# Patient Record
Sex: Female | Born: 1935 | Race: White | Hispanic: No | Smoking: Never smoker
Health system: Southern US, Community
[De-identification: ages and names within clinical notes are randomized; demographics above are authoritative.]

## PROBLEM LIST (undated history)

## (undated) DIAGNOSIS — I10 Essential (primary) hypertension: Secondary | ICD-10-CM

## (undated) DIAGNOSIS — M1990 Unspecified osteoarthritis, unspecified site: Secondary | ICD-10-CM

## (undated) DIAGNOSIS — Z0000 Encounter for general adult medical examination without abnormal findings: Secondary | ICD-10-CM

## (undated) DIAGNOSIS — K648 Other hemorrhoids: Secondary | ICD-10-CM

## (undated) DIAGNOSIS — R000 Tachycardia, unspecified: Secondary | ICD-10-CM

## (undated) DIAGNOSIS — C801 Malignant (primary) neoplasm, unspecified: Secondary | ICD-10-CM

## (undated) DIAGNOSIS — E079 Disorder of thyroid, unspecified: Secondary | ICD-10-CM

## (undated) DIAGNOSIS — I471 Supraventricular tachycardia, unspecified: Secondary | ICD-10-CM

## (undated) DIAGNOSIS — M1611 Unilateral primary osteoarthritis, right hip: Secondary | ICD-10-CM

## (undated) DIAGNOSIS — H6120 Impacted cerumen, unspecified ear: Secondary | ICD-10-CM

## (undated) DIAGNOSIS — C4491 Basal cell carcinoma of skin, unspecified: Secondary | ICD-10-CM

## (undated) DIAGNOSIS — R739 Hyperglycemia, unspecified: Secondary | ICD-10-CM

## (undated) DIAGNOSIS — B019 Varicella without complication: Secondary | ICD-10-CM

## (undated) DIAGNOSIS — M2550 Pain in unspecified joint: Secondary | ICD-10-CM

## (undated) DIAGNOSIS — E663 Overweight: Secondary | ICD-10-CM

## (undated) HISTORY — DX: Supraventricular tachycardia: I47.1

## (undated) HISTORY — DX: Unilateral primary osteoarthritis, right hip: M16.11

## (undated) HISTORY — DX: Encounter for general adult medical examination without abnormal findings: Z00.00

## (undated) HISTORY — PX: BREAST SURGERY: SHX581

## (undated) HISTORY — DX: Disorder of thyroid, unspecified: E07.9

## (undated) HISTORY — DX: Basal cell carcinoma of skin, unspecified: C44.91

## (undated) HISTORY — PX: DILATION AND CURETTAGE OF UTERUS: SHX78

## (undated) HISTORY — PX: TONSILLECTOMY: SUR1361

## (undated) HISTORY — DX: Pain in unspecified joint: M25.50

## (undated) HISTORY — DX: Tachycardia, unspecified: R00.0

## (undated) HISTORY — DX: Impacted cerumen, unspecified ear: H61.20

## (undated) HISTORY — DX: Supraventricular tachycardia, unspecified: I47.10

## (undated) HISTORY — DX: Other hemorrhoids: K64.8

## (undated) HISTORY — DX: Overweight: E66.3

## (undated) HISTORY — DX: Varicella without complication: B01.9

## (undated) HISTORY — DX: Hyperglycemia, unspecified: R73.9

## (undated) HISTORY — DX: Essential (primary) hypertension: I10

## (undated) HISTORY — DX: Unspecified osteoarthritis, unspecified site: M19.90

## (undated) HISTORY — PX: CATARACT EXTRACTION, BILATERAL: SHX1313

## (undated) HISTORY — DX: Malignant (primary) neoplasm, unspecified: C80.1

---

## 2000-08-26 LAB — HM COLONOSCOPY

## 2009-08-04 ENCOUNTER — Emergency Department (HOSPITAL_COMMUNITY): Admission: EM | Admit: 2009-08-04 | Discharge: 2009-08-04 | Payer: Self-pay | Admitting: Emergency Medicine

## 2010-11-27 LAB — DIFFERENTIAL
Basophils Relative: 0 % (ref 0–1)
Lymphocytes Relative: 15 % (ref 12–46)
Lymphs Abs: 1.8 10*3/uL (ref 0.7–4.0)
Monocytes Absolute: 0.6 10*3/uL (ref 0.1–1.0)
Monocytes Relative: 5 % (ref 3–12)
Neutro Abs: 9.3 10*3/uL — ABNORMAL HIGH (ref 1.7–7.7)

## 2010-11-27 LAB — POCT CARDIAC MARKERS
CKMB, poc: 1.7 ng/mL (ref 1.0–8.0)
Myoglobin, poc: 74 ng/mL (ref 12–200)
Troponin i, poc: <0.05 ng/mL (ref 0.00–0.09)

## 2010-11-27 LAB — URINALYSIS, ROUTINE W REFLEX MICROSCOPIC
Glucose, UA: NEGATIVE mg/dL
Ketones, ur: NEGATIVE mg/dL
Leukocytes, UA: NEGATIVE
Protein, ur: NEGATIVE mg/dL
pH: 7.5 (ref 5.0–8.0)

## 2010-11-27 LAB — CBC
Hemoglobin: 15.1 g/dL — ABNORMAL HIGH (ref 12.0–15.0)
MCHC: 34 g/dL (ref 30.0–36.0)
RBC: 4.55 MIL/uL (ref 3.87–5.11)

## 2010-11-27 LAB — URINE CULTURE: Colony Count: 30000

## 2010-11-27 LAB — BASIC METABOLIC PANEL
GFR calc non Af Amer: >60 mL/min (ref >60)
Glucose, Bld: 101 mg/dL — ABNORMAL HIGH (ref 70–99)
Potassium: 4 mEq/L (ref 3.5–5.1)
Sodium: 137 mEq/L (ref 135–145)

## 2010-11-27 LAB — URINE MICROSCOPIC-ADD ON

## 2011-03-13 DIAGNOSIS — M79673 Pain in unspecified foot: Secondary | ICD-10-CM | POA: Insufficient documentation

## 2011-12-03 ENCOUNTER — Ambulatory Visit (INDEPENDENT_AMBULATORY_CARE_PROVIDER_SITE_OTHER): Payer: Medicare Other | Admitting: Family Medicine

## 2011-12-03 ENCOUNTER — Encounter: Payer: Self-pay | Admitting: Family Medicine

## 2011-12-03 VITALS — BP 129/79 | HR 71 | Temp 97.3°F | Ht 62.0 in | Wt 161.0 lb

## 2011-12-03 DIAGNOSIS — I471 Supraventricular tachycardia: Secondary | ICD-10-CM | POA: Insufficient documentation

## 2011-12-03 DIAGNOSIS — C801 Malignant (primary) neoplasm, unspecified: Secondary | ICD-10-CM | POA: Insufficient documentation

## 2011-12-03 DIAGNOSIS — I10 Essential (primary) hypertension: Secondary | ICD-10-CM | POA: Insufficient documentation

## 2011-12-03 DIAGNOSIS — R000 Tachycardia, unspecified: Secondary | ICD-10-CM | POA: Insufficient documentation

## 2011-12-03 DIAGNOSIS — I498 Other specified cardiac arrhythmias: Secondary | ICD-10-CM

## 2011-12-03 DIAGNOSIS — H6120 Impacted cerumen, unspecified ear: Secondary | ICD-10-CM

## 2011-12-03 DIAGNOSIS — E663 Overweight: Secondary | ICD-10-CM

## 2011-12-03 DIAGNOSIS — H9190 Unspecified hearing loss, unspecified ear: Secondary | ICD-10-CM

## 2011-12-03 DIAGNOSIS — K649 Unspecified hemorrhoids: Secondary | ICD-10-CM

## 2011-12-03 DIAGNOSIS — Z0000 Encounter for general adult medical examination without abnormal findings: Secondary | ICD-10-CM

## 2011-12-03 HISTORY — DX: Overweight: E66.3

## 2011-12-03 HISTORY — DX: Impacted cerumen, unspecified ear: H61.20

## 2011-12-03 HISTORY — DX: Encounter for general adult medical examination without abnormal findings: Z00.00

## 2011-12-03 MED ORDER — ANTIPYRINE-BENZOCAINE 5.4-1.4 % OT SOLN: 3.0000 [drp] | Freq: Every evening | OTIC | Status: AC | PRN

## 2011-12-03 MED ORDER — LISINOPRIL 10 MG PO TABS: 10.0000 mg | ORAL_TABLET | Freq: Two times a day (BID) | ORAL | Status: DC

## 2011-12-03 MED ORDER — METOPROLOL TARTRATE 25 MG PO TABS: 12.5000 mg | ORAL_TABLET | Freq: Two times a day (BID) | ORAL | Status: DC

## 2011-12-03 NOTE — Assessment & Plan Note (Signed)
She cannot remember which type, will request old records

## 2011-12-03 NOTE — Assessment & Plan Note (Signed)
Well controlled on current meds, no changes 

## 2011-12-03 NOTE — Assessment & Plan Note (Signed)
Patient denies any breast concerns or change in bowel habits will request old records and consider lab work at next visit as indicate

## 2011-12-03 NOTE — Assessment & Plan Note (Signed)
Will check labs and request old records before making recommendations

## 2011-12-03 NOTE — Assessment & Plan Note (Signed)
Well controlled on Metoprolol, no changes

## 2011-12-03 NOTE — Patient Instructions (Signed)
Cerumen Impaction A cerumen impaction is when the wax in your ear forms a plug. This plug usually causes reduced hearing. Sometimes it also causes an earache or dizziness. Removing a cerumen impaction can be difficult and painful. The wax sticks to the ear canal. The canal is sensitive and bleeds easily. If you try to remove a heavy wax buildup with a cotton tipped swab, you may push it in further. Irrigation with water, suction, and small ear curettes may be used to clear out the wax. If the impaction is fixed to the skin in the ear canal, ear drops may be needed for a few days to loosen the wax. People who build up a lot of wax frequently can use ear wax removal products available in your local drugstore. SEEK MEDICAL CARE IF:  You develop an earache, increased hearing loss, or marked dizziness. Document Released: 09/19/2004 Document Revised: 08/01/2011 Document Reviewed: 11/09/2009 [facility] Patient Information 2012 [place], [place].   Place the ear drops in both ears at bedtime for 5 days prior to next visit next week

## 2011-12-03 NOTE — Assessment & Plan Note (Signed)
Right greater than left. Given her relative and drops to apply to ears each bedtime for 5 nights prior to visit next week. We will then flushed the ears as tolerated

## 2011-12-03 NOTE — Assessment & Plan Note (Signed)
Patient notes roughly 20 years ago having an injury on a plane flight that resulted in a lot of ear pain and hearing loss at that time. Since then she's had progressive hearing loss. She's had 2 sets of hearing aids. At first it was helpful for about 10 years. They said she got up to half years ago and then relocated here from [place]. She's noted for about a year now her hearing is worsening. She does not have anybody in the area to help her manage this we will refer her to a cardiology at this time for further evaluation and adjustment of her hearing aids as needed.

## 2011-12-03 NOTE — Assessment & Plan Note (Signed)
Long history no recent trouble, no bleeding, encouraged her to cleanse with [facility] Astringent and to use Preparation H prn.

## 2011-12-03 NOTE — Progress Notes (Signed)
Patient ID: Sophia Hancock, female   DOB: Apr 29, 1928, 78 y.o.   MRN: [id number] DARLY MASSI [id number] February 18, 1928 12/03/2011      Progress Note New Patient  Subjective  Chief Complaint  Chief Complaint  Patient presents with  . Establish Care    new patient    HPI  Patient is a 21  -year-old Caucasian female in today for new patient appointment she is relocated back to [place] from [place] in the last 2 years. She can use her primary care doctor as her previous primary care doctor has left. Overall she is doing well. She is maintained on metoprolol and lisinopril for history of hypertension and SVT but has no difficulties there. Has trouble historically with hemorrhoids but never any bleeding and no irritation at present. No other change in bowels are noted. She does note history of hearing loss dating back 20 years. She was flying an airplane when there was some pressure problem and she's hearing loss is progressive and present. On her second set of hearing aids. Notes her most recent year she to have 3 years ago and for about the last 2 years hearing has not continually worse. She denies tinnitus or other neurologic complaints. Denies headaches. She's not had a recent illness, fevers, chills, congestion, GI or GU complaints noted today appear   Past Medical History  Diagnosis Date  . Measles as a child  . Mumps as a child  . Chicken pox as a child  . Hypertension   . Pulse fast   . Hemorrhoids   . SVT (supraventricular tachycardia)     h/o  . Cancer     skin , place on left arm and nose  . Overweight 12/03/2011  . Hearing loss 12/03/2011  . Cerumen impaction 12/03/2011  . Preventative health care 12/03/2011    Past Surgical History  Procedure Date  . Cataract extraction, bilateral     one eye was done twice  . Tonsillectomy   . Breast surgery     benign X 2, b/l    Family History  Problem Relation Age of Onset  . Hypertension Mother   . Anemia Mother   . Arthritis  Father     severe  . Aneurysm Sister     aortic  . Cancer Sister     breast  . Heart disease Sister     aortic aneurysm  . Kidney disease Sister     dialysis  . COPD Sister   . Aneurysm Brother     adbominal  . Aortic aneurysm Brother   . Stroke Paternal Grandfather   . Cholecystitis Daughter     History   Social History  . Marital Status: Widowed    Spouse Name: N/A    Number of Children: N/A  . Years of Education: N/A   Occupational History  . Not on file.   Social History Main Topics  . Smoking status: Never Smoker   . Smokeless tobacco: Never Used  . Alcohol Use: No  . Drug Use: No  . Sexually Active: No   Other Topics Concern  . Not on file   Social History Narrative  . No narrative on file    Current Outpatient Prescriptions on File Prior to Visit  Medication Sig Dispense Refill  . lisinopril (PRINIVIL,ZESTRIL) 10 MG tablet Take 1 tablet (10 mg total) by mouth 2 (two) times daily.  90 tablet  3  . metoprolol tartrate (LOPRESSOR) 25 MG tablet Take 0.5  tablets (12.5 mg total) by mouth 2 (two) times daily.  180 tablet  3    Allergies  Allergen Reactions  . Tylenol (Acetaminophen)     anxiety    Review of Systems  Review of Systems  Constitutional: Negative for fever, chills and malaise/fatigue.  HENT: Positive for hearing loss. Negative for ear pain, nosebleeds, congestion and tinnitus.   Eyes: Negative for discharge.  Respiratory: Negative for cough, sputum production, shortness of breath and wheezing.   Cardiovascular: Negative for chest pain, palpitations and leg swelling.  Gastrointestinal: Negative for heartburn, nausea, vomiting, abdominal pain, diarrhea, constipation, blood in stool and melena.  Genitourinary: Negative for dysuria, urgency, frequency and hematuria.  Musculoskeletal: Negative for myalgias, back pain and falls.  Skin: Negative for rash.  Neurological: Negative for dizziness, tremors, sensory change, focal weakness, loss of  consciousness, weakness and headaches.  Endo/Heme/Allergies: Negative for polydipsia. Does not bruise/bleed easily.  Psychiatric/Behavioral: Negative for depression and suicidal ideas. The patient is not nervous/anxious and does not have insomnia.     Objective  BP 129/79  Pulse 71  Temp(Src) 97.3 F (36.3 C) (Temporal)  Ht 5\' 2"  (1.575 m)  Wt 161 lb (73.029 kg)  BMI 29.45 kg/m2  SpO2 97%  Physical Exam  Physical Exam  Constitutional: She is oriented to person, place, and time and well-developed, well-nourished, and in no distress. No distress.  HENT:  Head: Normocephalic and atraumatic.  Nose: Nose normal.  Mouth/Throat: Oropharynx is clear and moist. No oropharyngeal exudate.       Cerumen impacted on right, partially on left  Eyes: Conjunctivae are normal. Pupils are equal, round, and reactive to light. Right eye exhibits no discharge. Left eye exhibits no discharge. No scleral icterus.  Neck: Normal range of motion. Neck supple. No thyromegaly present.  Cardiovascular: Normal rate, regular rhythm, normal heart sounds and intact distal pulses.   No murmur heard. Pulmonary/Chest: Effort normal and breath sounds normal. No respiratory distress. She has no wheezes. She has no rales.  Abdominal: Soft. Bowel sounds are normal. She exhibits no distension and no mass. There is no tenderness.  Musculoskeletal: Normal range of motion. She exhibits no edema and no tenderness.  Lymphadenopathy:    She has no cervical adenopathy.  Neurological: She is alert and oriented to person, place, and time. She has normal reflexes. No cranial nerve deficit. Coordination normal.  Skin: Skin is warm and dry. No rash noted. She is not diaphoretic.  Psychiatric: Mood, memory and affect normal.       Assessment & Plan  Overweight Will check labs and request old records before making recommendations  Hypertension Well controlled on current meds, no changes  SVT (supraventricular  tachycardia) Well controlled on Metoprolol, no changes  Hemorrhoids Long history no recent trouble, no bleeding, encouraged her to cleanse with [facility] Astringent and to use Preparation H prn.  Cancer She cannot remember which type, will request old records  Cerumen impaction Right greater than left. Given her relative and drops to apply to ears each bedtime for 5 nights prior to visit next week. We will then flushed the ears as tolerated  Hearing loss Patient notes roughly 20 years ago having an injury on a plane flight that resulted in a lot of ear pain and hearing loss at that time. Since then she's had progressive hearing loss. She's had 2 sets of hearing aids. At first it was helpful for about 10 years. They said she got up to half years ago and  then relocated here from [place]. She's noted for about a year now her hearing is worsening. She does not have anybody in the area to help her manage this we will refer her to a cardiology at this time for further evaluation and adjustment of her hearing aids as needed.  Preventative health care Patient denies any breast concerns or change in bowel habits will request old records and consider lab work at next visit as indicate

## 2013-02-04 ENCOUNTER — Encounter: Payer: Self-pay | Admitting: Family Medicine

## 2013-02-04 ENCOUNTER — Ambulatory Visit (INDEPENDENT_AMBULATORY_CARE_PROVIDER_SITE_OTHER): Payer: Medicare Other | Admitting: Family Medicine

## 2013-02-04 VITALS — BP 112/70 | HR 67 | Temp 98.3°F | Ht 62.0 in | Wt 158.0 lb

## 2013-02-04 DIAGNOSIS — R7309 Other abnormal glucose: Secondary | ICD-10-CM

## 2013-02-04 DIAGNOSIS — K649 Unspecified hemorrhoids: Secondary | ICD-10-CM

## 2013-02-04 DIAGNOSIS — E663 Overweight: Secondary | ICD-10-CM

## 2013-02-04 DIAGNOSIS — R739 Hyperglycemia, unspecified: Secondary | ICD-10-CM

## 2013-02-04 DIAGNOSIS — I10 Essential (primary) hypertension: Secondary | ICD-10-CM

## 2013-02-04 MED ORDER — LISINOPRIL 10 MG PO TABS: 5.0000 mg | ORAL_TABLET | Freq: Two times a day (BID) | ORAL | Status: DC

## 2013-02-04 MED ORDER — METOPROLOL SUCCINATE ER 25 MG PO TB24: 25.0000 mg | ORAL_TABLET | Freq: Every day | ORAL | Status: DC

## 2013-02-04 MED ORDER — HYDROCHLOROTHIAZIDE 12.5 MG PO CAPS: 12.5000 mg | ORAL_CAPSULE | ORAL | Status: DC

## 2013-02-04 NOTE — Patient Instructions (Addendum)
Try Salon Pas patches or creams  Want to see bp < 145/92   Back Pain, Adult Low back pain is very common. About 1 in 5 people have back pain.The cause of low back pain is rarely dangerous. The pain often gets better over time.About half of people with a sudden onset of back pain feel better in just 2 weeks. About 8 in 10 people feel better by 6 weeks.  CAUSES Some common causes of back pain include:  Strain of the muscles or ligaments supporting the spine.  Wear and tear (degeneration) of the spinal discs.  Arthritis.  Direct injury to the back. DIAGNOSIS Most of the time, the direct cause of low back pain is not known.However, back pain can be treated effectively even when the exact cause of the pain is unknown.Answering your caregiver's questions about your overall health and symptoms is one of the most accurate ways to make sure the cause of your pain is not dangerous. If your caregiver needs more information, he or she may order lab work or imaging tests (X-rays or MRIs).However, even if imaging tests show changes in your back, this usually does not require surgery. HOME CARE INSTRUCTIONS For many people, back pain returns.Since low back pain is rarely dangerous, it is often a condition that people can learn to [facility] their own.   Remain active. It is stressful on the back to sit or stand in one place. Do not sit, drive, or stand in one place for more than 30 minutes at a time. Take short walks on level surfaces as soon as pain allows.Try to increase the length of time you walk each day.  Do not stay in bed.Resting more than 1 or 2 days can delay your recovery.  Do not avoid exercise or work.Your body is made to move.It is not dangerous to be active, even though your back may hurt.Your back will likely heal faster if you return to being active before your pain is gone.  Pay attention to your body when you bend and lift. Many people have less discomfortwhen lifting if  they bend their knees, keep the load close to their bodies,and avoid twisting. Often, the most comfortable positions are those that put less stress on your recovering back.  Find a comfortable position to sleep. Use a firm mattress and lie on your side with your knees slightly bent. If you lie on your back, put a pillow under your knees.  Only take over-the-counter or prescription medicines as directed by your caregiver. Over-the-counter medicines to reduce pain and inflammation are often the most helpful.Your caregiver may prescribe muscle relaxant drugs.These medicines help dull your pain so you can more quickly return to your normal activities and healthy exercise.  Put ice on the injured area.  Put ice in a plastic bag.  Place a towel between your skin and the bag.  Leave the ice on for 15-20 minutes, 3-4 times a day for the first 2 to 3 days. After that, ice and heat may be alternated to reduce pain and spasms.  Ask your caregiver about trying back exercises and gentle massage. This may be of some benefit.  Avoid feeling anxious or stressed.Stress increases muscle tension and can worsen back pain.It is important to recognize when you are anxious or stressed and learn ways to manage it.Exercise is a great option. SEEK MEDICAL CARE IF:  You have pain that is not relieved with rest or medicine.  You have pain that does not improve in  1 week.  You have new symptoms.  You are generally not feeling well. SEEK IMMEDIATE MEDICAL CARE IF:   You have pain that radiates from your back into your legs.  You develop new bowel or bladder control problems.  You have unusual weakness or numbness in your arms or legs.  You develop nausea or vomiting.  You develop abdominal pain.  You feel faint. Document Released: 08/12/2005 Document Revised: 02/11/2012 Document Reviewed: 12/31/2010 [facility] Patient Information 2014 [place], [place].   Hypertension As your heart beats, it forces  blood through your arteries. This force is your blood pressure. If the pressure is too high, it is called hypertension (HTN) or high blood pressure. HTN is dangerous because you may have it and not know it. High blood pressure may mean that your heart has to work harder to pump blood. Your arteries may be narrow or stiff. The extra work puts you at risk for heart disease, stroke, and other problems.  Blood pressure consists of two numbers, a higher number over a lower, 110/72, for example. It is stated as "110 over 72." The ideal is below 120 for the top number (systolic) and under 80 for the bottom (diastolic). Write down your blood pressure today. You should pay close attention to your blood pressure if you have certain conditions such as:  Heart failure.  Prior heart attack.  Diabetes  Chronic kidney disease.  Prior stroke.  Multiple risk factors for heart disease. To see if you have HTN, your blood pressure should be measured while you are seated with your arm held at the level of the heart. It should be measured at least twice. A one-time elevated blood pressure reading (especially in the Emergency Department) does not mean that you need treatment. There may be conditions in which the blood pressure is different between your right and left arms. It is important to see your caregiver soon for a recheck. Most people have essential hypertension which means that there is not a specific cause. This type of high blood pressure may be lowered by changing lifestyle factors such as:  Stress.  Smoking.  Lack of exercise.  Excessive weight.  Drug/tobacco/alcohol use.  Eating less salt. Most people do not have symptoms from high blood pressure until it has caused damage to the body. Effective treatment can often prevent, delay or reduce that damage. TREATMENT  When a cause has been identified, treatment for high blood pressure is directed at the cause. There are a large number of medications  to treat HTN. These fall into several categories, and your caregiver will help you select the medicines that are best for you. Medications may have side effects. You should review side effects with your caregiver. If your blood pressure stays high after you have made lifestyle changes or started on medicines,   Your medication(s) may need to be changed.  Other problems may need to be addressed.  Be certain you understand your prescriptions, and know how and when to take your medicine.  Be sure to follow up with your caregiver within the time frame advised (usually within two weeks) to have your blood pressure rechecked and to review your medications.  If you are taking more than one medicine to lower your blood pressure, make sure you know how and at what times they should be taken. Taking two medicines at the same time can result in blood pressure that is too low. SEEK IMMEDIATE MEDICAL CARE IF:  You develop a severe headache, blurred or  changing vision, or confusion.  You have unusual weakness or numbness, or a faint feeling.  You have severe chest or abdominal pain, vomiting, or breathing problems. MAKE SURE YOU:   Understand these instructions.  Will watch your condition.  Will get help right away if you are not doing well or get worse. Document Released: 08/12/2005 Document Revised: 11/04/2011 Document Reviewed: 04/01/2008 [facility] Patient Information 2014 [place], [place].   Hemorrhoids Hemorrhoids are swollen veins around the rectum or anus. There are two types of hemorrhoids:   Internal hemorrhoids. These occur in the veins just inside the rectum. They may poke through to the outside and become irritated and painful.  External hemorrhoids. These occur in the veins outside the anus and can be felt as a painful swelling or hard lump near the anus. CAUSES  Pregnancy.   Obesity.   Constipation or diarrhea.   Straining to have a bowel movement.   Sitting for long  periods on the toilet.  Heavy lifting or other activity that caused you to strain.  Anal intercourse. SYMPTOMS   Pain.   Anal itching or irritation.   Rectal bleeding.   Fecal leakage.   Anal swelling.   One or more lumps around the anus.  DIAGNOSIS  Your caregiver may be able to diagnose hemorrhoids by visual examination. Other examinations or tests that may be performed include:   Examination of the rectal area with a gloved hand (digital rectal exam).   Examination of anal canal using a small tube (scope).   A blood test if you have lost a significant amount of blood.  A test to look inside the colon (sigmoidoscopy or colonoscopy). TREATMENT Most hemorrhoids can be treated at home. However, if symptoms do not seem to be getting better or if you have a lot of rectal bleeding, your caregiver may perform a procedure to help make the hemorrhoids get smaller or remove them completely. Possible treatments include:   Placing a rubber band at the base of the hemorrhoid to cut off the circulation (rubber band ligation).   Injecting a chemical to shrink the hemorrhoid (sclerotherapy).   Using a tool to burn the hemorrhoid (infrared light therapy).   Surgically removing the hemorrhoid (hemorrhoidectomy).   Stapling the hemorrhoid to block blood flow to the tissue (hemorrhoid stapling).  HOME CARE INSTRUCTIONS   Eat foods with fiber, such as whole grains, beans, nuts, fruits, and vegetables. Ask your doctor about taking products with added fiber in them (fibersupplements).  Increase fluid intake. Drink enough water and fluids to keep your urine clear or pale yellow.   Exercise regularly.   Go to the bathroom when you have the urge to have a bowel movement. Do not wait.   Avoid straining to have bowel movements.   Keep the anal area dry and clean. Use wet toilet paper or moist towelettes after a bowel movement.   Medicated creams and suppositories may be  used or applied as directed.   Only take over-the-counter or prescription medicines as directed by your caregiver.   Take warm sitz baths for 15 20 minutes, 3 4 times a day to ease pain and discomfort.   Place ice packs on the hemorrhoids if they are tender and swollen. Using ice packs between sitz baths may be helpful.   Put ice in a plastic bag.   Place a towel between your skin and the bag.   Leave the ice on for 15 20 minutes, 3 4 times a day.  Do not use a donut-shaped pillow or sit on the toilet for long periods. This increases blood pooling and pain.  SEEK MEDICAL CARE IF:  You have increasing pain and swelling that is not controlled by treatment or medicine.  You have uncontrolled bleeding.  You have difficulty or you are unable to have a bowel movement.  You have pain or inflammation outside the area of the hemorrhoids. MAKE SURE YOU:  Understand these instructions.  Will watch your condition.  Will get help right away if you are not doing well or get worse. Document Released: 08/09/2000 Document Revised: 07/29/2012 Document Reviewed: 06/16/2012 [facility] Patient Information 2014 [employer], [place].

## 2013-02-05 ENCOUNTER — Encounter: Payer: Self-pay | Admitting: Internal Medicine

## 2013-02-05 LAB — HEPATIC FUNCTION PANEL
Bilirubin, Direct: 0.1 mg/dL (ref 0.0–0.3)
Indirect Bilirubin: 0.4 mg/dL (ref 0.0–0.9)
Total Protein: 6.3 g/dL (ref 6.0–8.3)

## 2013-02-05 LAB — CBC
MCHC: 32.8 g/dL (ref 30.0–36.0)
RDW: 13.4 % (ref 11.5–15.5)

## 2013-02-05 LAB — RENAL FUNCTION PANEL
Albumin: 3.9 g/dL (ref 3.5–5.2)
Calcium: 9.2 mg/dL (ref 8.4–10.5)
Phosphorus: 3.4 mg/dL (ref 2.3–4.6)
Potassium: 3.8 mEq/L (ref 3.5–5.3)
Sodium: 140 mEq/L (ref 135–145)

## 2013-02-05 LAB — LIPID PANEL
Cholesterol: 192 mg/dL (ref 0–200)
Total CHOL/HDL Ratio: 4.6 Ratio
Triglycerides: 108 mg/dL (ref <150)
VLDL: 22 mg/dL (ref 0–40)

## 2013-02-05 LAB — TSH: TSH: 1.907 u[IU]/mL (ref 0.350–4.500)

## 2013-02-06 ENCOUNTER — Encounter: Payer: Self-pay | Admitting: Family Medicine

## 2013-02-06 DIAGNOSIS — R739 Hyperglycemia, unspecified: Secondary | ICD-10-CM

## 2013-02-06 HISTORY — DX: Hyperglycemia, unspecified: R73.9

## 2013-02-06 NOTE — Assessment & Plan Note (Signed)
Well controlled on current meds. No changes. Given refills today.

## 2013-02-06 NOTE — Assessment & Plan Note (Signed)
Encouraged DASH diet, increase activity levels

## 2013-02-06 NOTE — Assessment & Plan Note (Signed)
Encouraged to minimize simple carbohydrates. Increase exercise.

## 2013-02-06 NOTE — Progress Notes (Signed)
Patient ID: Sophia Hancock, female   DOB: 1927-11-15, 79 y.o.   MRN: [id number] Sophia Hancock [id number] 1928/04/18 02/06/2013      Progress Note-Follow Up  Subjective  Chief Complaint  Chief Complaint  Patient presents with  . Follow-up    medication refill    HPI  Patient is an 79 year old Caucasian female in today with her daughter for evaluation. She is complaining of symptomatic hemorrhoids. Has trouble moving her bowels at times. Occasionally sees blood. Denies abdominal pain, nausea, vomiting or anorexia. Denies recent illness. No chest pain, palpitations, shortness of breath, GU complaints  Past Medical History  Diagnosis Date  . Measles as a child  . Mumps as a child  . Chicken pox as a child  . Hypertension   . Pulse fast   . Hemorrhoids   . SVT (supraventricular tachycardia)     h/o  . Cancer     skin , place on left arm and nose  . Overweight(278.02) 12/03/2011  . Hearing loss 12/03/2011  . Cerumen impaction 12/03/2011  . Preventative health care 12/03/2011  . Hyperglycemia 02/06/2013    Past Surgical History  Procedure Laterality Date  . Cataract extraction, bilateral      one eye was done twice  . Tonsillectomy    . Breast surgery      benign X 2, b/l    Family History  Problem Relation Age of Onset  . Hypertension Mother   . Anemia Mother   . Arthritis Father     severe  . Aneurysm Sister     aortic  . Cancer Sister     breast  . Heart disease Sister     aortic aneurysm  . Kidney disease Sister     dialysis  . COPD Sister   . Aneurysm Brother     adbominal  . Aortic aneurysm Brother   . Stroke Paternal Grandfather   . Cholecystitis Daughter     History   Social History  . Marital Status: Widowed    Spouse Name: N/A    Number of Children: N/A  . Years of Education: N/A   Occupational History  . Not on file.   Social History Main Topics  . Smoking status: Never Smoker   . Smokeless tobacco: Never Used  . Alcohol Use: No  . Drug Use:  No  . Sexually Active: No   Other Topics Concern  . Not on file   Social History Narrative  . No narrative on file    Current Outpatient Prescriptions on File Prior to Visit  Medication Sig Dispense Refill  . ibuprofen (ADVIL,MOTRIN) 200 MG tablet Take 200 mg by mouth every 6 (six) hours as needed.       No current facility-administered medications on file prior to visit.    Allergies  Allergen Reactions  . Tylenol (Acetaminophen)     anxiety    Review of Systems  Review of Systems  Constitutional: Negative for fever and malaise/fatigue.  HENT: Negative for congestion.   Eyes: Negative for discharge.  Respiratory: Negative for shortness of breath.   Cardiovascular: Negative for chest pain, palpitations and leg swelling.  Gastrointestinal: Positive for blood in stool. Negative for nausea, abdominal pain, diarrhea and melena.  Genitourinary: Negative for dysuria.  Musculoskeletal: Negative for falls.  Skin: Negative for rash.  Neurological: Negative for loss of consciousness and headaches.  Endo/Heme/Allergies: Negative for polydipsia.  Psychiatric/Behavioral: Negative for depression and suicidal ideas. The patient is not nervous/anxious and  does not have insomnia.     Objective  BP 112/70  Pulse 67  Temp(Src) 98.3 F (36.8 C) (Oral)  Ht 5\' 2"  (1.575 m)  Wt 158 lb (71.668 kg)  BMI 28.89 kg/m2  SpO2 99%  Physical Exam  Physical Exam  Constitutional: She is oriented to person, place, and time and well-developed, well-nourished, and in no distress. No distress.  HENT:  Head: Normocephalic and atraumatic.  Eyes: Conjunctivae are normal.  Neck: Neck supple. No thyromegaly present.  Cardiovascular: Normal rate, regular rhythm and normal heart sounds.   No murmur heard. Pulmonary/Chest: Effort normal and breath sounds normal. She has no wheezes.  Abdominal: She exhibits no distension and no mass.  Musculoskeletal: She exhibits no edema.  Lymphadenopathy:    She  has no cervical adenopathy.  Neurological: She is alert and oriented to person, place, and time.  Skin: Skin is warm and dry. No rash noted. She is not diaphoretic.  Psychiatric: Memory, affect and judgment normal.    Lab Results  Component Value Date   TSH 1.907 02/04/2013   Lab Results  Component Value Date   WBC 10.1 02/04/2013   HGB 13.1 02/04/2013   HCT 39.9 02/04/2013   MCV 94.1 02/04/2013   PLT 242 02/04/2013   Lab Results  Component Value Date   CREATININE 0.95 02/04/2013   BUN 25* 02/04/2013   NA 140 02/04/2013   K 3.8 02/04/2013   CL 101 02/04/2013   CO2 27 02/04/2013   Lab Results  Component Value Date   ALT 11 02/04/2013   AST 20 02/04/2013   ALKPHOS 74 02/04/2013   BILITOT 0.5 02/04/2013   Lab Results  Component Value Date   CHOL 192 02/04/2013   Lab Results  Component Value Date   HDL 42 02/04/2013   Lab Results  Component Value Date   LDLCALC 128* 02/04/2013   Lab Results  Component Value Date   TRIG 108 02/04/2013   Lab Results  Component Value Date   CHOLHDL 4.6 02/04/2013     Assessment & Plan  Hypertension Well controlled on current meds. No changes. Given refills today.   Hyperglycemia Encouraged to minimize simple carbohydrates. Increase exercise.  Overweight(278.02) Encouraged DASH diet, increase activity levels  Hemorrhoids Hemorrhoids are painful and bleeding at times, is referred to gastroenterology for evaluation

## 2013-02-06 NOTE — Assessment & Plan Note (Signed)
Hemorrhoids are painful and bleeding at times, is referred to gastroenterology for evaluation

## 2013-02-08 ENCOUNTER — Encounter: Payer: Self-pay | Admitting: Family Medicine

## 2013-02-08 NOTE — Telephone Encounter (Signed)
Please advise? I haven't called lab results yet?

## 2013-03-05 ENCOUNTER — Ambulatory Visit: Payer: Medicare Other | Admitting: Internal Medicine

## 2013-03-31 ENCOUNTER — Other Ambulatory Visit: Payer: Self-pay

## 2013-04-13 ENCOUNTER — Encounter: Payer: Self-pay | Admitting: Internal Medicine

## 2013-04-13 ENCOUNTER — Ambulatory Visit (INDEPENDENT_AMBULATORY_CARE_PROVIDER_SITE_OTHER): Payer: Medicare Other | Admitting: Internal Medicine

## 2013-04-13 VITALS — BP 110/68 | HR 72 | Ht 61.5 in | Wt 154.0 lb

## 2013-04-13 DIAGNOSIS — K648 Other hemorrhoids: Secondary | ICD-10-CM

## 2013-04-13 HISTORY — DX: Other hemorrhoids: K64.8

## 2013-04-13 NOTE — Assessment & Plan Note (Signed)
She has external tags and internal hemorrhoids. Hx and exam w/ DRE and anoscopy confirm. Given overall hx would not perform endoscopic evaluation at her age - do not think screening her makes sense and she and daughter agree. She was mainly concerned that hemorrhoids could cause cancer and I explained that they do not. We reviewed possible in-office ligation of internal hemorrhoids but have decided to reserve that for more problems than she has now - she believes she is tolerating these ok now. I will see her as needed.

## 2013-04-13 NOTE — Patient Instructions (Addendum)
Today you have been given a handout on hemorrhoid treatment we do here in the office.   I appreciate the opportunity to care for you.

## 2013-04-13 NOTE — Progress Notes (Signed)
  Subjective:    Patient ID: Sophia Hancock, female    DOB: 1928/07/02, 79 y.o.   MRN: [id number]  HPI This is a very nice elderly woman here with daughter to discuss hemorrhoids and she is concerned that there could be a risk of cancer associated with them. She recalls having a colonoscopy in about 2002 - [place], and thinks she may have had one polyp. She has had hemnorrhoids for "50 years or more". There is very rare bleeding but she does have some problems with swelling, discomfort and itching more frequently. However, "not too bad" now. Her main concern today is if they could lead to cancer. She is moving her bowels w/ difficulty or changes and about every day. She uses a salve (Nupercaine) when external tags bother her.  She did report occasional constipation to Dr. Abner Greenspan earlier this year. Is using prunes with benefit.  GI ROS o/w negative.  Medications, allergies, past medical history, past surgical history, family history and social history are reviewed and updated in the EMR.  Review of Systems Hard of hearing, arthritis pain with cane to assist walking, ? Excessive urination All other ROS negative today    Objective:   Physical Exam General:  Well-developed, well-nourished and in no acute distress Eyes:  anicteric. ENT:   Mouth and posterior pharynx free of lesions.  Neck:   supple w/o thyromegaly or mass.  Lungs: Clear to auscultation bilaterally. Heart:  S1S2, no rubs, murmurs, gallops. Abdomen:  soft, non-tender, no hepatosplenomegaly, hernia, or mass and BS+.  Rectal: Female staff present + circumferential anal tags, normal resting tone w/o mass or sig rectocele, some paradoxical descent w/ request to squeeze and no sig descent w/ valsalva  Anoscopy - Grade 1-2 internal hemorrhoids RA>LL>RP Lymph:  no cervical or supraclavicular adenopathy. Extremities:   no edema Skin   no rash. Neuro:  A&O x 3.  Psych:  appropriate mood and  Affect.   Data Reviewed: PCP notes Labs  in EMR Lab Results  Component Value Date   WBC 10.1 02/04/2013   HGB 13.1 02/04/2013   HCT 39.9 02/04/2013   MCV 94.1 02/04/2013   PLT 242 02/04/2013      Assessment & Plan:

## 2013-04-22 ENCOUNTER — Encounter: Payer: Self-pay | Admitting: Family Medicine

## 2013-04-22 ENCOUNTER — Ambulatory Visit (HOSPITAL_BASED_OUTPATIENT_CLINIC_OR_DEPARTMENT_OTHER)
Admission: RE | Admit: 2013-04-22 | Discharge: 2013-04-22 | Disposition: A | Discharge status: Home / Self Care | Payer: Medicare Other | Source: Ambulatory Visit | Attending: Family Medicine | Admitting: Family Medicine

## 2013-04-22 ENCOUNTER — Telehealth: Payer: Self-pay | Admitting: Family Medicine

## 2013-04-22 ENCOUNTER — Ambulatory Visit (INDEPENDENT_AMBULATORY_CARE_PROVIDER_SITE_OTHER): Payer: Medicare Other | Admitting: Family Medicine

## 2013-04-22 VITALS — BP 112/67 | HR 67 | Temp 97.9°F | Ht 62.0 in | Wt 154.0 lb

## 2013-04-22 DIAGNOSIS — M1611 Unilateral primary osteoarthritis, right hip: Secondary | ICD-10-CM

## 2013-04-22 DIAGNOSIS — M25551 Pain in right hip: Secondary | ICD-10-CM

## 2013-04-22 DIAGNOSIS — M169 Osteoarthritis of hip, unspecified: Secondary | ICD-10-CM

## 2013-04-22 DIAGNOSIS — M25559 Pain in unspecified hip: Secondary | ICD-10-CM

## 2013-04-22 DIAGNOSIS — I10 Essential (primary) hypertension: Secondary | ICD-10-CM

## 2013-04-22 DIAGNOSIS — M898X9 Other specified disorders of bone, unspecified site: Secondary | ICD-10-CM | POA: Insufficient documentation

## 2013-04-22 DIAGNOSIS — R739 Hyperglycemia, unspecified: Secondary | ICD-10-CM

## 2013-04-22 DIAGNOSIS — R7309 Other abnormal glucose: Secondary | ICD-10-CM

## 2013-04-22 NOTE — Patient Instructions (Addendum)

## 2013-04-22 NOTE — Telephone Encounter (Signed)
LAB ORDER WEEK OF 08-12-2014 Labs prior to next visit, lipid, renal, cbc, tsh, hepatic, hgba1c

## 2013-04-26 ENCOUNTER — Encounter: Payer: Self-pay | Admitting: Family Medicine

## 2013-04-26 DIAGNOSIS — M1611 Unilateral primary osteoarthritis, right hip: Secondary | ICD-10-CM

## 2013-04-26 HISTORY — DX: Unilateral primary osteoarthritis, right hip: M16.11

## 2013-04-26 NOTE — Assessment & Plan Note (Signed)
Patient describes some recent discomfort and feeling as if her hip could give way on her. Will try Salon Pas patches and if continues to worsen will need referral to ortho

## 2013-04-26 NOTE — Progress Notes (Signed)
Patient ID: Sophia Hancock, female   DOB: 24-Mar-1928, 79 y.o.   MRN: [id number] Sophia Hancock [id number] 1928-04-23 04/26/2013      Progress Note-Follow Up  Subjective  Chief Complaint  Chief Complaint  Patient presents with  . Follow-up    2 month    HPI  Patient is an 79 year old Caucasian female who is in today for followup. She's complaining of some recent right hip dysfunction. She has a sense of it giving out at times. Never has fallen. No numbness tingling or weakness down the arm. No recent trauma. She finds herself just uncomfortable with her hip. No trouble to lift it. Sugars have been relatively well controlled. Recent fingerstick was 140. No polyuria or polydipsia. No chest pain, palpitations, shortness of breath, GI or GU concerns today.  Past Medical History  Diagnosis Date  . Measles as a child  . Mumps as a child  . Chicken pox as a child  . Hypertension   . Pulse fast   . Hemorrhoids   . SVT (supraventricular tachycardia)     h/o  . Cancer     skin , place on left arm and nose  . Overweight(278.02) 12/03/2011  . Hearing loss 12/03/2011  . Cerumen impaction 12/03/2011  . Preventative health care 12/03/2011  . Hyperglycemia 02/06/2013  . Joint pain   . Internal hemorrhoids with bleeding and pain 04/13/2013  . Osteoarthritis of right hip 04/26/2013    Past Surgical History  Procedure Laterality Date  . Cataract extraction, bilateral      one eye was done twice  . Tonsillectomy    . Breast surgery      benign X 2, b/l    Family History  Problem Relation Age of Onset  . Hypertension Mother   . Anemia Mother   . Arthritis Father     severe  . Aneurysm Sister     aortic  . Breast cancer Sister   . Heart disease Sister     aortic aneurysm  . Kidney disease Sister     dialysis  . COPD Sister   . Aneurysm Brother     adbominal  . Aortic aneurysm Brother   . Stroke Paternal Grandfather   . Cholecystitis Daughter     History   Social History  . Marital  Status: Widowed    Spouse Name: N/A    Number of Children: N/A  . Years of Education: N/A   Occupational History  . Not on file.   Social History Main Topics  . Smoking status: Never Smoker   . Smokeless tobacco: Never Used  . Alcohol Use: No  . Drug Use: No  . Sexual Activity: No   Other Topics Concern  . Not on file   Social History Narrative  . No narrative on file    Current Outpatient Prescriptions on File Prior to Visit  Medication Sig Dispense Refill  . hydrochlorothiazide (MICROZIDE) 12.5 MG capsule Take 1 capsule (12.5 mg total) by mouth every morning.  30 capsule  2  . ibuprofen (ADVIL,MOTRIN) 200 MG tablet Take 200 mg by mouth every 6 (six) hours as needed.      Marland Kitchen lisinopril (PRINIVIL,ZESTRIL) 10 MG tablet Take 0.5 tablets (5 mg total) by mouth 2 (two) times daily.  30 tablet  5  . metoprolol succinate (TOPROL-XL) 25 MG 24 hr tablet Take 1 tablet (25 mg total) by mouth daily.  30 tablet  5   No current facility-administered medications on file  prior to visit.    Allergies  Allergen Reactions  . Tylenol [Acetaminophen]     anxiety    Review of Systems  Physical Exam  Constitutional: She is oriented to person, place, and time and well-developed, well-nourished, and in no distress. No distress.  HENT:  Head: Normocephalic and atraumatic.  Eyes: Conjunctivae are normal.  Neck: Neck supple. No thyromegaly present.  Cardiovascular: Normal rate, regular rhythm and normal heart sounds.   No murmur heard. Pulmonary/Chest: Effort normal and breath sounds normal. She has no wheezes.  Abdominal: She exhibits no distension and no mass.  Musculoskeletal: She exhibits no edema.  Lymphadenopathy:    She has no cervical adenopathy.  Neurological: She is alert and oriented to person, place, and time.  Skin: Skin is warm and dry. No rash noted. She is not diaphoretic.  Psychiatric: Memory, affect and judgment normal.    Objective  BP 112/67  Pulse 67  Temp(Src)  97.9 F (36.6 C) (Oral)  Ht 5\' 2"  (1.575 m)  Wt 154 lb 0.6 oz (69.872 kg)  BMI 28.17 kg/m2  SpO2 95%  Physical Exam  Review of Systems  Constitutional: Negative for fever, chills and malaise/fatigue.  HENT: Negative for hearing loss, nosebleeds and congestion.   Eyes: Negative for discharge.  Respiratory: Negative for cough, sputum production, shortness of breath and wheezing.   Cardiovascular: Negative for chest pain, palpitations and leg swelling.  Gastrointestinal: Negative for heartburn, nausea, vomiting, abdominal pain, diarrhea, constipation and blood in stool.  Genitourinary: Negative for dysuria, urgency, frequency and hematuria.  Musculoskeletal: Positive for joint pain. Negative for myalgias, back pain and falls.       Right hip  Skin: Negative for rash.  Neurological: Negative for dizziness, tremors, sensory change, focal weakness, loss of consciousness, weakness and headaches.  Endo/Heme/Allergies: Negative for polydipsia. Does not bruise/bleed easily.  Psychiatric/Behavioral: Negative for depression and suicidal ideas. The patient is not nervous/anxious and does not have insomnia.     Lab Results  Component Value Date   TSH 1.907 02/04/2013   Lab Results  Component Value Date   WBC 10.1 02/04/2013   HGB 13.1 02/04/2013   HCT 39.9 02/04/2013   MCV 94.1 02/04/2013   PLT 242 02/04/2013   Lab Results  Component Value Date   CREATININE 0.95 02/04/2013   BUN 25* 02/04/2013   NA 140 02/04/2013   K 3.8 02/04/2013   CL 101 02/04/2013   CO2 27 02/04/2013   Lab Results  Component Value Date   ALT 11 02/04/2013   AST 20 02/04/2013   ALKPHOS 74 02/04/2013   BILITOT 0.5 02/04/2013   Lab Results  Component Value Date   CHOL 192 02/04/2013   Lab Results  Component Value Date   HDL 42 02/04/2013   Lab Results  Component Value Date   LDLCALC 128* 02/04/2013   Lab Results  Component Value Date   TRIG 108 02/04/2013   Lab Results  Component Value Date   CHOLHDL 4.6  02/04/2013     Assessment & Plan  Osteoarthritis of right hip Patient describes some recent discomfort and feeling as if her hip could give way on her. Will try Salon Pas patches and if continues to worsen will need referral to ortho  Hypertension Well controlled, no changes  Hyperglycemia Blood sugars well controlled, minimize simple carbs

## 2013-04-26 NOTE — Assessment & Plan Note (Signed)
Well controlled, no changes 

## 2013-04-26 NOTE — Assessment & Plan Note (Signed)
Blood sugars well controlled, minimize simple carbs

## 2013-04-27 NOTE — Progress Notes (Signed)
Quick Note:  Patients daughter informed and voiced understanding ______

## 2013-08-21 ENCOUNTER — Other Ambulatory Visit: Payer: Self-pay | Admitting: Family Medicine

## 2013-08-24 ENCOUNTER — Ambulatory Visit: Payer: Medicare Other | Admitting: Family Medicine

## 2013-08-24 DIAGNOSIS — Z0289 Encounter for other administrative examinations: Secondary | ICD-10-CM

## 2013-10-07 ENCOUNTER — Other Ambulatory Visit: Payer: Self-pay | Admitting: Family Medicine

## 2013-10-07 NOTE — Telephone Encounter (Signed)
Rx request Denied, Rx not on active medication list, pt will need appointment/SLS

## 2013-10-08 ENCOUNTER — Ambulatory Visit (INDEPENDENT_AMBULATORY_CARE_PROVIDER_SITE_OTHER): Payer: Medicare Other | Admitting: Family

## 2013-10-08 ENCOUNTER — Encounter: Payer: Self-pay | Admitting: Family

## 2013-10-08 VITALS — BP 120/74 | HR 69 | Temp 97.9°F | Resp 16 | Wt 156.0 lb

## 2013-10-08 DIAGNOSIS — H6692 Otitis media, unspecified, left ear: Secondary | ICD-10-CM

## 2013-10-08 DIAGNOSIS — H6690 Otitis media, unspecified, unspecified ear: Secondary | ICD-10-CM

## 2013-10-08 MED ORDER — AMOXICILLIN 500 MG PO CAPS: 500.0000 mg | ORAL_CAPSULE | Freq: Three times a day (TID) | ORAL | Status: DC

## 2013-10-08 MED ORDER — ANTIPYRINE-BENZOCAINE 5.4-1.4 % OT SOLN: 3.0000 [drp] | Freq: Every evening | OTIC | Status: DC | PRN

## 2013-10-08 NOTE — Patient Instructions (Signed)
Otitis Media, Adult °Otitis media is redness, soreness, and puffiness (swelling) in the space just behind your eardrum (middle ear). It may be caused by allergies or infection. It often happens along with a cold. °HOME CARE °· Take your medicine as told. Finish it even if you start to feel better. °· Only take over-the-counter or prescription medicines for pain, discomfort, or fever as told by your doctor. °· Follow up with your doctor as told. °GET HELP IF: °· You have otitis media only in one ear or bleeding from your nose or both. °· You notice a lump on your neck. °· You are not getting better in 3 5 days. °· You feel worse instead of better. °GET HELP RIGHT AWAY IF:  °· You have pain that is not helped with medicine. °· You have puffiness, redness, or pain around your ear. °· You get a stiff neck. °· You cannot move part of your face (paralysis). °· You notice that the bone behind your ear hurts when you touch it. °MAKE SURE YOU:  °· Understand these instructions. °· Will watch your condition. °· Will get help right away if you are not doing well or get worse. °Document Released: 01/29/2008 Document Revised: 04/14/2013 Document Reviewed: 03/09/2013 °[employer]® Patient Information ©2014 [employer]. ° °

## 2013-10-08 NOTE — Progress Notes (Signed)
Subjective:    Patient ID: Sophia Hancock, female    DOB: 02/15/1928, 80 y.o.   MRN: [id number]  HPI  Ms. Bissonette is an 80 yr old female who presents today with chief complaint of left sided ear pain. She reports intermittent otalgia for years. Most recently, pain started 1 week ago. She reports mild associated nasal drainage.  Reports pain is improved by use of auralgan solution. Denies associated fever. The patient is very hard of hearing and the daughter provides much of the history today.   Review of Systems    see HPI  Past Medical History  Diagnosis Date  . Measles as a child  . Mumps as a child  . Chicken pox as a child  . Hypertension   . Pulse fast   . Hemorrhoids   . SVT (supraventricular tachycardia)     h/o  . Cancer     skin , place on left arm and nose  . Overweight 12/03/2011  . Hearing loss 12/03/2011  . Cerumen impaction 12/03/2011  . Preventative health care 12/03/2011  . Hyperglycemia 02/06/2013  . Joint pain   . Internal hemorrhoids with bleeding and pain 04/13/2013  . Osteoarthritis of right hip 04/26/2013    History   Social History  . Marital Status: Widowed    Spouse Name: N/A    Number of Children: N/A  . Years of Education: N/A   Occupational History  . Not on file.   Social History Main Topics  . Smoking status: Never Smoker   . Smokeless tobacco: Never Used  . Alcohol Use: No  . Drug Use: No  . Sexual Activity: No   Other Topics Concern  . Not on file   Social History Narrative  . No narrative on file    Past Surgical History  Procedure Laterality Date  . Cataract extraction, bilateral      one eye was done twice  . Tonsillectomy    . Breast surgery      benign X 2, b/l    Family History  Problem Relation Age of Onset  . Hypertension Mother   . Anemia Mother   . Arthritis Father     severe  . Aneurysm Sister     aortic  . Breast cancer Sister   . Heart disease Sister     aortic aneurysm  . Kidney disease Sister    dialysis  . COPD Sister   . Aneurysm Brother     adbominal  . Aortic aneurysm Brother   . Stroke Paternal Grandfather   . Cholecystitis Daughter     Allergies  Allergen Reactions  . Tylenol [Acetaminophen]     anxiety    Current Outpatient Prescriptions on File Prior to Visit  Medication Sig Dispense Refill  . hydrochlorothiazide (MICROZIDE) 12.5 MG capsule TAKE ONE CAPSULE BY MOUTH EVERY MORNING  30 capsule  2  . ibuprofen (ADVIL,MOTRIN) 200 MG tablet Take 200 mg by mouth every 6 (six) hours as needed.      Marland Kitchen lisinopril (PRINIVIL,ZESTRIL) 10 MG tablet Take 0.5 tablets (5 mg total) by mouth 2 (two) times daily.  30 tablet  5  . metoprolol succinate (TOPROL-XL) 25 MG 24 hr tablet Take 1 tablet (25 mg total) by mouth daily.  30 tablet  5   No current facility-administered medications on file prior to visit.    BP 120/74  Pulse 69  Temp(Src) 97.9 F (36.6 C) (Oral)  Resp 16  Wt 156 lb  0.6 oz (70.779 kg)  SpO2 99%    Objective:   Physical Exam  Constitutional: She is oriented to person, place, and time. She appears well-developed and well-nourished. No distress.  HENT:  Head: Normocephalic and atraumatic.  L TM is mildly erythematous without bulging. R TM is normal.   Cardiovascular: Normal rate and regular rhythm.   No murmur heard. Pulmonary/Chest: Effort normal and breath sounds normal. No respiratory distress. She has no wheezes. She has no rales. She exhibits no tenderness.  Neurological: She is alert and oriented to person, place, and time.  Psychiatric: She has a normal mood and affect. Her behavior is normal. Judgment and thought content normal.          Assessment & Plan:

## 2013-10-08 NOTE — Assessment & Plan Note (Signed)
80 yr old female presents today with L OM. Rx provided for amoxicillin and refill on auralgan. Pt is instructed to call if symptoms worsen, or if symptoms are not improved in 2-3 days.

## 2013-10-26 ENCOUNTER — Telehealth: Payer: Self-pay

## 2013-10-26 NOTE — Telephone Encounter (Signed)
Patients daughter left a message stating that her mom (pt) received a $50 no show bill in the mail. Pts daughter stated in message that they did not believe they were supposed to have an appt that day and saw the provider in Feb and no one said anything.  I called back and left a message per pts daughters request and stated the appt for 08-24-13 was made after the 04-22-13 visit.   Pt also saw Melissa not Dr Charlett Blake in Feb.

## 2013-11-02 ENCOUNTER — Other Ambulatory Visit: Payer: Self-pay

## 2013-11-02 DIAGNOSIS — I10 Essential (primary) hypertension: Secondary | ICD-10-CM

## 2013-11-02 MED ORDER — METOPROLOL SUCCINATE ER 25 MG PO TB24: 25.0000 mg | ORAL_TABLET | Freq: Every day | ORAL | Status: DC

## 2013-11-02 NOTE — Telephone Encounter (Signed)
Patients daughter called stating that the patient needs her Metoprolol refilled.  I will send in RX but I also informed pts daughter that pt would need a follow up with Dr Charlett Blake within the next couple of months.  pts daughter voiced understanding

## 2013-11-03 ENCOUNTER — Telehealth: Payer: Self-pay | Admitting: Family Medicine

## 2013-11-03 DIAGNOSIS — I10 Essential (primary) hypertension: Secondary | ICD-10-CM

## 2013-11-03 NOTE — Telephone Encounter (Signed)
[facility]  metroprolol patient is out

## 2013-11-05 MED ORDER — METOPROLOL SUCCINATE ER 25 MG PO TB24: 25.0000 mg | ORAL_TABLET | Freq: Every day | ORAL | Status: DC

## 2013-11-05 NOTE — Telephone Encounter (Signed)
Resent rx to [facility].

## 2013-11-29 ENCOUNTER — Other Ambulatory Visit: Payer: Self-pay | Admitting: Family Medicine

## 2013-12-15 DIAGNOSIS — M810 Age-related osteoporosis without current pathological fracture: Secondary | ICD-10-CM | POA: Insufficient documentation

## 2013-12-29 ENCOUNTER — Ambulatory Visit (INDEPENDENT_AMBULATORY_CARE_PROVIDER_SITE_OTHER): Payer: Medicare Other | Admitting: Neurology

## 2013-12-29 ENCOUNTER — Encounter (INDEPENDENT_AMBULATORY_CARE_PROVIDER_SITE_OTHER): Payer: Self-pay

## 2013-12-29 ENCOUNTER — Encounter: Payer: Self-pay | Admitting: Neurology

## 2013-12-29 VITALS — BP 133/76 | HR 70 | Ht 62.5 in | Wt 154.0 lb

## 2013-12-29 DIAGNOSIS — M5416 Radiculopathy, lumbar region: Secondary | ICD-10-CM

## 2013-12-29 DIAGNOSIS — G629 Polyneuropathy, unspecified: Secondary | ICD-10-CM

## 2013-12-29 DIAGNOSIS — Z0289 Encounter for other administrative examinations: Secondary | ICD-10-CM

## 2013-12-29 DIAGNOSIS — G544 Lumbosacral root disorders, not elsewhere classified: Secondary | ICD-10-CM

## 2013-12-29 DIAGNOSIS — G609 Hereditary and idiopathic neuropathy, unspecified: Secondary | ICD-10-CM

## 2013-12-29 DIAGNOSIS — IMO0002 Reserved for concepts with insufficient information to code with codable children: Secondary | ICD-10-CM

## 2013-12-29 NOTE — Patient Instructions (Addendum)
Overall you are doing fairly well but I do want to suggest a few things today:   As far as your medications are concerned, I would like to suggest you initially try just Lyrica 50mg  nightly  As far as diagnostic testing:  1)Please have some blood work checked today 2)Please schedule an EMG/NCS  I would like to see you back once the workup is completed, sooner if we need to. Please call us with any interim questions, concerns, problems, updates or refill requests.   My clinical assistant and will answer any of your questions and relay your messages to me and also relay most of my messages to you.   Our phone number is [phone]. We also have an after hours call service for urgent matters and there is a physician on-call for urgent questions. For any emergencies you know to call 911 or go to the nearest emergency room

## 2013-12-29 NOTE — Procedures (Signed)
     HISTORY:  Sophia Hancock is an 80 year old patient with a history of back pain, right leg discomfort. The patient fell approximately 2 weeks ago, and she has had some back and right leg discomfort since that time. She is being evaluated for this issue.  NERVE CONDUCTION STUDIES:  Nerve conduction studies were performed on both lower extremities. The distal motor latencies for the peroneal nerves were normal bilaterally, but relatively more prolonged on the right than the left. The distal motor latencies and motor amplitudes for the posterior tibial nerves were normal bilaterally. The motor amplitudes for the peroneal nerves were low on the right, and normal on the left. The nerve conduction velocities for the peroneal and posterior tibial nerves were normal bilaterally. The H reflex latency on the right was absent, normal on the left. The peroneal sensory latencies were normal bilaterally.  EMG STUDIES:  EMG study was performed on the right lower extremity:  The tibialis anterior muscle reveals 2 to 5K motor units with slightly decreased recruitment. No fibrillations or positive waves were seen. The peroneus tertius muscle reveals 2 to 4K motor units with full recruitment. No fibrillations or positive waves were seen. The medial gastrocnemius muscle reveals 1 to 3K motor units with full recruitment. No fibrillations or positive waves were seen. The vastus lateralis muscle reveals 2 to 5K motor units with slightly decreased recruitment. No fibrillations or positive waves were seen. The iliopsoas muscle reveals 2 to 4K motor units with full recruitment. No fibrillations or positive waves were seen. The biceps femoris muscle (long head) reveals 2 to 4K motor units with full recruitment. No fibrillations or positive waves were seen. The lumbosacral paraspinal muscles were tested at 3 levels, and revealed no abnormalities of insertional activity at the upper level tested. One to 2+ fibrillations  and positive waves were seen at the middle and lower levels. There was good relaxation.   IMPRESSION:  Nerve conduction studies done on both lower extremities shows some dysfunction involving the peroneal nerves with sensory sparing. This may be related to distal dysfunction of the nerve. EMG evaluation of the right lower extremity was almost normal, but subtle chronic findings of denervation were seen suggesting a very low-grade chronic L4 or L5 radiculopathy. Lumbosacral paraspinal muscles were acutely denervated. It is possible that the patient may have an evolving acute lumbosacral radiculopathy not apparent yet on EMG evaluation. Evolution of denervation on EMG may take 3-4 weeks following an acute injury. Clinical correlation is required.  Jill Alexanders MD 12/29/2013 4:27 PM  [facility] [address] [place], [place] [address]  Phone [phone] Fax [phone]

## 2013-12-29 NOTE — Progress Notes (Signed)
[facility]    Provider:  Dr Janann Colonel Referring Provider: Mosie Lukes, MD Primary Care Physician:  Curly Rim, MD  CC:  Peripheral neuropathy  HPI:  Sophia Hancock is a 80 y.o. female here as a referral from Dr. Neta Mends for evaluation of bilateral LE foot pain and weakness in her right hip.  Has had symptoms for years, it is getting progressively worse. Described as a burning sensation, sharp pain, some swelling. Symptoms are below the ankle. Worse with prolonged standing. Not worse at night when trying to sleep. No weakness in her feet. She does note some weakness in her right hip, was recently diagnosed with osteoperorosis. Reports that her leg (either hip or knee) will give out when she walks, recently started using a cane for walking. Notes some gait instability when walking, has had some severe falls recently. Falls are typically mechanical in nature. Recently prescribed Lyrica 50mg  three times a day, has not started taking it yet.   Had B12 checked in 2010 and it was 353.   Review of Systems: Out of a complete 14 system review, the patient complains of only the following symptoms, and all other reviewed systems are negative. + joint pain, hearing loss  History   Social History  . Marital Status: Widowed    Spouse Name: N/A    Number of Children: N/A  . Years of Education: N/A   Occupational History  . Not on file.   Social History Main Topics  . Smoking status: Never Smoker   . Smokeless tobacco: Never Used  . Alcohol Use: No  . Drug Use: No  . Sexual Activity: No   Other Topics Concern  . Not on file   Social History Narrative   Widowed, 1 child   Right handed   Masters degree   2 cups daily decaff    Family History  Problem Relation Age of Onset  . Hypertension Mother   . Anemia Mother   . Arthritis Father     severe  . Aneurysm Sister     aortic  . Breast cancer Sister   . Heart disease Sister     aortic aneurysm  .  Kidney disease Sister     dialysis  . COPD Sister   . Aneurysm Brother     adbominal  . Aortic aneurysm Brother   . Stroke Paternal Grandfather   . Cholecystitis Daughter     Past Medical History  Diagnosis Date  . Measles as a child  . Mumps as a child  . Chicken pox as a child  . Hypertension   . Pulse fast   . Hemorrhoids   . SVT (supraventricular tachycardia)     h/o  . Cancer     skin , place on left arm and nose  . Overweight 12/03/2011  . Hearing loss 12/03/2011  . Cerumen impaction 12/03/2011  . Preventative health care 12/03/2011  . Hyperglycemia 02/06/2013  . Joint pain   . Internal hemorrhoids with bleeding and pain 04/13/2013  . Osteoarthritis of right hip 04/26/2013    Past Surgical History  Procedure Laterality Date  . Cataract extraction, bilateral      one eye was done twice  . Tonsillectomy    . Breast surgery      benign X 2, b/l    Current Outpatient Prescriptions  Medication Sig Dispense Refill  . amoxicillin (AMOXIL) 500 MG capsule Take 1 capsule (500 mg total) by mouth 3 (three) times daily.  30 capsule  0  . antipyrine-benzocaine (AURALGAN) otic solution Place 3 drops into both ears at bedtime as needed for ear pain.  10 mL  0  . fluticasone (FLONASE) 50 MCG/ACT nasal spray       . hydrochlorothiazide (MICROZIDE) 12.5 MG capsule TAKE ONE CAPSULE BY MOUTH EVERY MORNING  30 capsule  2  . ibuprofen (ADVIL,MOTRIN) 200 MG tablet Take 200 mg by mouth every 6 (six) hours as needed.      Marland Kitchen lisinopril (PRINIVIL,ZESTRIL) 10 MG tablet TAKE 1/2 TABLET BY MOUTH TWICE A DAY  30 tablet  0  . LYRICA 50 MG capsule       . metoprolol succinate (TOPROL-XL) 25 MG 24 hr tablet Take 1 tablet (25 mg total) by mouth daily.  30 tablet  1   No current facility-administered medications for this visit.    Allergies as of 12/29/2013 - Review Complete 12/29/2013  Allergen Reaction Noted  . Tylenol [acetaminophen]  12/03/2011    Vitals: BP 133/76  Pulse 70  Ht 5' 2.5"  (1.588 m)  Wt 154 lb (69.854 kg)  BMI 27.70 kg/m2 Last Weight:  Wt Readings from Last 1 Encounters:  12/29/13 154 lb (69.854 kg)   Last Height:   Ht Readings from Last 1 Encounters:  12/29/13 5' 2.5" (1.588 m)     Physical exam: Exam: Gen: NAD, conversant Eyes: anicteric sclerae, moist conjunctivae HENT: Atraumatic, oropharynx clear Neck: Trachea midline; supple,  Lungs: CTA, no wheezing, rales, rhonic                          CV: RRR, no MRG Abdomen: Soft, non-tender;  Extremities: No peripheral edema  Skin: Normal temperature, no rash,  Psych: Appropriate affect, pleasant  Neuro: MS: AA&Ox3, appropriately interactive, normal affect   Speech: fluent w/o paraphasic error  Memory: good recent and remote recall  CN: PERRL, EOMI no nystagmus, no ptosis, sensation intact to LT V1-V3 bilat, face symmetric, no weakness, hearing grossly intact, palate elevates symmetrically, shoulder shrug 5/5 bilat,  tongue protrudes midline, no fasiculations noted. Patient is hard of hearing R>L  Motor: normal bulk and tone Strength: 5/5  In all extremities with exception of 4+/5 in right hip flexor  Coord: rapid alternating and point-to-point (FNF, HTS) movements intact.  Reflexes: symmetrical, bilat downgoing toes  Sens: decreased LT, PP, temp and vibration bilateral LE R>L  Gait: walks with assistance of a cane, slightly antalgic favoring RLE   Assessment:  After physical and neurologic examination, review of laboratory studies, imaging, neurophysiology testing and pre-existing records, assessment will be reviewed on the problem list.  Plan:  Treatment plan and additional workup will be reviewed under Problem List.  1)peripheral neuropathy 2)weakness  80y/o woman presenting for initial evaluation of bilateral LE painful paresthesias and pain/weakness in RLE. History and symptoms consistent with bilateral LE peripheral neuropathy and question of a RLE lumbar radiculopathy. Will  check blood work, EMG/NCS. Patient was started on Lyrica 50mg  TID by PCP. Will initially start 50mg  qhs and titrate up slowly as tolerated. Follow up once workup completed.   Jim Like, DO  [facility] [address] [place], [place] [address]  Phone [phone] Fax [phone]

## 2013-12-31 LAB — VITAMIN B12: VITAMIN B 12: 334 pg/mL (ref 211–946)

## 2013-12-31 LAB — HGB A1C W/O EAG: HEMOGLOBIN A1C: 6 % — AB (ref 4.8–5.6)

## 2013-12-31 LAB — METHYLMALONIC ACID, SERUM: Methylmalonic Acid: 651 nmol/L — ABNORMAL HIGH (ref 0–378)

## 2013-12-31 LAB — TSH: TSH: 2.33 u[IU]/mL (ref 0.450–4.500)

## 2014-01-03 NOTE — Progress Notes (Signed)
Quick Note:  Attempted to call patient again, with result information, when calling daughter's number, the vm says "this is Darnelle Maffucci", not clear if this is the correct number for Sophia Hancock, patient's home number may be incorrect as well ______

## 2014-01-03 NOTE — Progress Notes (Signed)
Quick Note:  Called patient's daughter left vm to call back with, for lab results. ______

## 2014-01-04 NOTE — Progress Notes (Signed)
Quick Note:  Called patient, no answer left message to return the call. ______

## 2014-01-05 ENCOUNTER — Other Ambulatory Visit: Payer: Self-pay | Admitting: Neurology

## 2014-01-05 DIAGNOSIS — M5416 Radiculopathy, lumbar region: Secondary | ICD-10-CM

## 2014-01-05 NOTE — Progress Notes (Signed)
Quick Note:  Left message on patient's voice mail, that an MRI of the lumbar spine has been ordered, and once insurance has approved, she will receive a call to schedule this appointment. ______

## 2014-01-05 NOTE — Progress Notes (Signed)
Quick Note:  Spoke with patient's daugther Wells Guiles, informed her of that patient will need to start on B-12 supplement 1000 mcg, and elevated blood sugar. Wells Guiles states that it was mentioned that [facility] may have an MRI, or a CT scan, would like to know if she will still needs to have the test. ______

## 2014-01-29 ENCOUNTER — Other Ambulatory Visit: Payer: Self-pay | Admitting: Family Medicine

## 2014-01-31 NOTE — Telephone Encounter (Signed)
Can you please call and check that pt is still seeing Dr Charlett Blake? It shows Kip Corrington for her PCP?

## 2014-01-31 NOTE — Telephone Encounter (Signed)
Spoke to patient daughter Wells Guiles and she states that patient is now under the care of Dr. Neta Mends

## 2014-03-16 ENCOUNTER — Ambulatory Visit: Payer: Medicare Other

## 2014-03-16 ENCOUNTER — Encounter: Payer: Self-pay | Admitting: Podiatry

## 2014-03-16 ENCOUNTER — Ambulatory Visit (INDEPENDENT_AMBULATORY_CARE_PROVIDER_SITE_OTHER): Payer: Medicare Other | Admitting: Podiatry

## 2014-03-16 ENCOUNTER — Ambulatory Visit (INDEPENDENT_AMBULATORY_CARE_PROVIDER_SITE_OTHER): Payer: Medicare Other

## 2014-03-16 VITALS — BP 146/86 | HR 60 | Resp 16 | Wt 165.0 lb

## 2014-03-16 DIAGNOSIS — M7750 Other enthesopathy of unspecified foot: Secondary | ICD-10-CM

## 2014-03-16 DIAGNOSIS — M779 Enthesopathy, unspecified: Secondary | ICD-10-CM

## 2014-03-17 NOTE — Progress Notes (Signed)
Subjective:     Patient ID: Sophia Hancock, female   DOB: [birth year], 80 y.o.   MRN: [id number]  Foot Pain   patient presents with daughter stating that my feet hurt me a lot when her on them and that even at times the sheet hurt at night. States she feels better in cushion shoes but nothing helps her entirely   Review of Systems  All other systems reviewed and are negative.      Objective:   Physical Exam  Nursing note and vitals reviewed. Constitutional: She is oriented to person, place, and time.  Cardiovascular: Intact distal pulses.   Neurological: She is oriented to person, place, and time.  Skin: Skin is dry.   neurovascular status is found to be diminished but intact with diminished range of motion subtalar midtarsal joint and elevation of the lesser digits right over left foot with distal keratotic lesions noted. Pain in the metatarsal heads of both feet of a diffuse pattern with digits found to be well perfused and moderate reduction of arch height upon ambulation     Assessment:     Possible structural issues versus possible low grade idiopathic peripheral neuropathy bilateral    Plan:     H&P and x-rays reviewed and explained condition to family. I scanned for a softer type orthotic to reduce forefoot stresses and reviewed the usage of this and patient will be seen back when those are ready and we will also discussed appropriate shoe gear usage

## 2014-03-21 ENCOUNTER — Other Ambulatory Visit: Payer: Self-pay | Admitting: Family Medicine

## 2014-03-24 ENCOUNTER — Other Ambulatory Visit: Payer: Self-pay | Admitting: Family Medicine

## 2014-04-11 ENCOUNTER — Ambulatory Visit: Payer: Medicare Other

## 2014-04-11 DIAGNOSIS — M779 Enthesopathy, unspecified: Secondary | ICD-10-CM

## 2014-04-11 NOTE — Patient Instructions (Signed)

## 2014-04-11 NOTE — Progress Notes (Signed)
   Subjective:    Patient ID: Sophia Hancock, female    DOB: June 24, 1928, 80 y.o.   MRN: [id number]  HPI  Pt is here to PUO   Review of Systems     Objective:   Physical Exam        Assessment & Plan:

## 2014-04-18 ENCOUNTER — Encounter: Payer: Self-pay | Admitting: Internal Medicine

## 2014-05-05 ENCOUNTER — Other Ambulatory Visit: Payer: Self-pay | Admitting: Family Medicine

## 2014-05-05 NOTE — Telephone Encounter (Signed)
Please advise pt that a month supply was sent to pharmacy but pt needs to be seen for further refills?  Is Dr Charlett Blake still her pcp?

## 2014-05-06 NOTE — Telephone Encounter (Signed)
Left message for patient to return my call.

## 2014-05-06 NOTE — Telephone Encounter (Signed)
Yes still PCP and will call back for appointment

## 2014-06-06 ENCOUNTER — Other Ambulatory Visit: Payer: Self-pay | Admitting: Family Medicine

## 2014-06-06 NOTE — Telephone Encounter (Signed)
Pt is no longer our patient.

## 2014-07-31 ENCOUNTER — Other Ambulatory Visit: Payer: Self-pay | Admitting: Family Medicine

## 2014-09-03 ENCOUNTER — Other Ambulatory Visit: Payer: Self-pay | Admitting: Family Medicine

## 2014-09-05 NOTE — Telephone Encounter (Signed)
Called patient to notify her that she is approaching a year since she has seen Dr. Charlett Blake.  Rx for HCTZ sent for 30 days with no refill.  Pt needs to schedule an apt.  Left message with family member for patient to return call.          EAL

## 2014-10-22 ENCOUNTER — Other Ambulatory Visit: Payer: Self-pay | Admitting: Family Medicine

## 2015-03-29 ENCOUNTER — Other Ambulatory Visit: Payer: Self-pay | Admitting: Otolaryngology

## 2015-03-29 ENCOUNTER — Other Ambulatory Visit (HOSPITAL_COMMUNITY)
Admission: RE | Admit: 2015-03-29 | Discharge: 2015-03-29 | Disposition: A | Discharge status: Home / Self Care | Payer: Medicare Other | Source: Ambulatory Visit | Attending: Otolaryngology | Admitting: Otolaryngology

## 2015-03-29 DIAGNOSIS — E049 Nontoxic goiter, unspecified: Secondary | ICD-10-CM | POA: Insufficient documentation

## 2015-03-31 ENCOUNTER — Other Ambulatory Visit: Payer: Self-pay | Admitting: Otolaryngology

## 2015-03-31 DIAGNOSIS — D440 Neoplasm of uncertain behavior of thyroid gland: Secondary | ICD-10-CM

## 2015-04-04 ENCOUNTER — Ambulatory Visit
Admission: RE | Admit: 2015-04-04 | Discharge: 2015-04-04 | Disposition: A | Discharge status: Home / Self Care | Payer: Medicare Other | Source: Ambulatory Visit | Attending: Otolaryngology | Admitting: Otolaryngology

## 2015-04-04 DIAGNOSIS — D440 Neoplasm of uncertain behavior of thyroid gland: Secondary | ICD-10-CM

## 2015-04-12 ENCOUNTER — Other Ambulatory Visit: Payer: Self-pay | Admitting: Otolaryngology

## 2015-04-12 DIAGNOSIS — E041 Nontoxic single thyroid nodule: Secondary | ICD-10-CM

## 2015-05-10 ENCOUNTER — Other Ambulatory Visit (HOSPITAL_COMMUNITY)
Admission: RE | Admit: 2015-05-10 | Discharge: 2015-05-10 | Disposition: A | Discharge status: Home / Self Care | Payer: Medicare Other | Source: Ambulatory Visit | Attending: Otolaryngology | Admitting: Otolaryngology

## 2015-05-10 ENCOUNTER — Ambulatory Visit
Admission: RE | Admit: 2015-05-10 | Discharge: 2015-05-10 | Disposition: A | Discharge status: Home / Self Care | Payer: Medicare Other | Source: Ambulatory Visit | Attending: Otolaryngology | Admitting: Otolaryngology

## 2015-05-10 DIAGNOSIS — E041 Nontoxic single thyroid nodule: Secondary | ICD-10-CM

## 2015-05-10 DIAGNOSIS — E042 Nontoxic multinodular goiter: Secondary | ICD-10-CM | POA: Diagnosis present

## 2015-08-31 ENCOUNTER — Other Ambulatory Visit: Payer: Self-pay | Admitting: Otolaryngology

## 2015-08-31 DIAGNOSIS — D440 Neoplasm of uncertain behavior of thyroid gland: Secondary | ICD-10-CM

## 2015-09-06 ENCOUNTER — Ambulatory Visit
Admission: RE | Admit: 2015-09-06 | Discharge: 2015-09-06 | Disposition: A | Discharge status: Home / Self Care | Payer: Medicare Other | Source: Ambulatory Visit | Attending: Otolaryngology | Admitting: Otolaryngology

## 2015-09-06 DIAGNOSIS — D440 Neoplasm of uncertain behavior of thyroid gland: Secondary | ICD-10-CM

## 2016-03-20 ENCOUNTER — Encounter: Payer: Self-pay | Admitting: Internal Medicine

## 2016-06-13 ENCOUNTER — Encounter: Payer: Self-pay | Admitting: Podiatry

## 2016-06-13 ENCOUNTER — Ambulatory Visit (INDEPENDENT_AMBULATORY_CARE_PROVIDER_SITE_OTHER): Payer: Medicare Other | Admitting: Podiatry

## 2016-06-13 DIAGNOSIS — L608 Other nail disorders: Secondary | ICD-10-CM

## 2016-06-13 DIAGNOSIS — M79609 Pain in unspecified limb: Principal | ICD-10-CM

## 2016-06-13 DIAGNOSIS — B351 Tinea unguium: Secondary | ICD-10-CM | POA: Diagnosis not present

## 2016-06-13 DIAGNOSIS — M79676 Pain in unspecified toe(s): Secondary | ICD-10-CM | POA: Diagnosis not present

## 2016-06-13 DIAGNOSIS — L603 Nail dystrophy: Secondary | ICD-10-CM

## 2016-06-15 NOTE — Progress Notes (Signed)

## 2016-07-08 IMAGING — US US SOFT TISSUE HEAD/NECK
1 series · 13 of 25 positions shown · non-contrast
Comparison: None.

CLINICAL DATA: 87-year-old female with thyroid neoplasm not
otherwise specified

EXAM:
THYROID ULTRASOUND
TECHNIQUE: Ultrasound examination of the thyroid gland and adjacent soft
tissues was performed.

[Series 1: us soft tissue head/neck · 0.08mm/px · 13 of 44 slices shown]
[im 1/44]
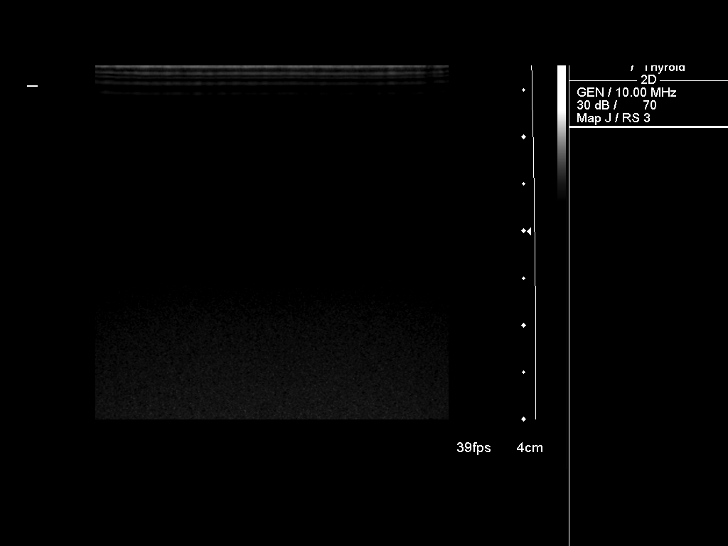
[im 4/44]
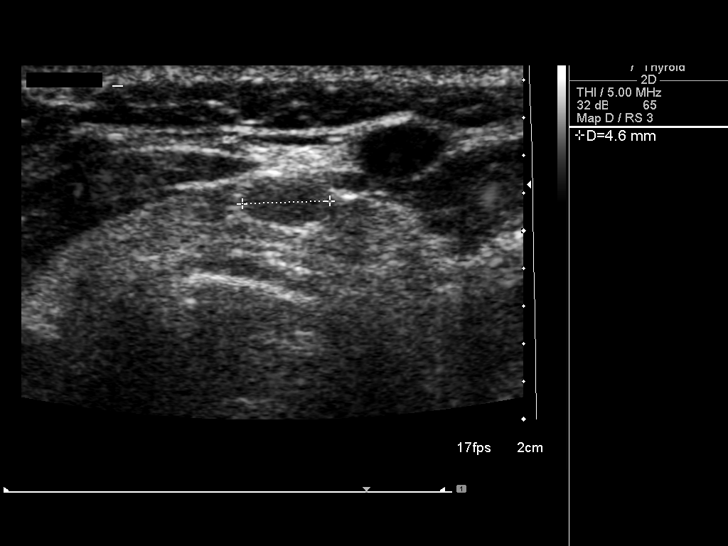
[im 8/44]
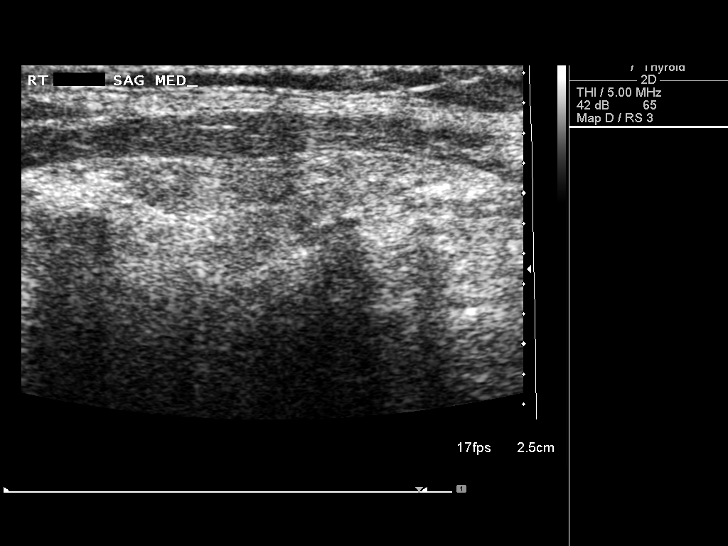
[im 11/44]
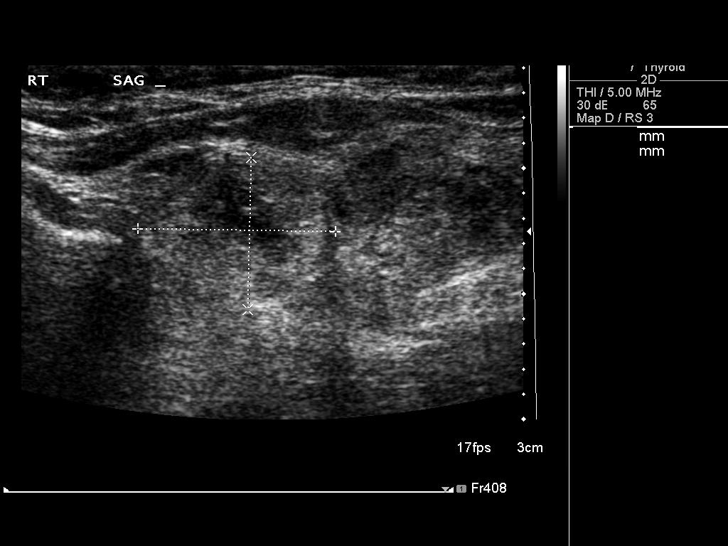
[im 15/44]
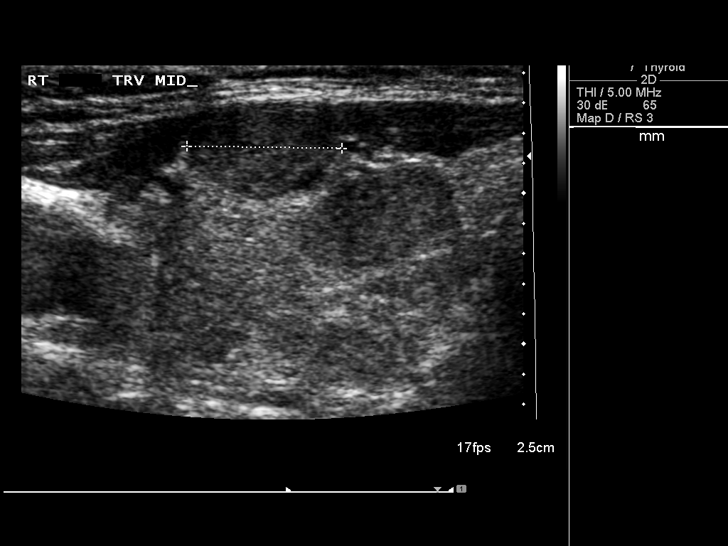
[im 18/44]
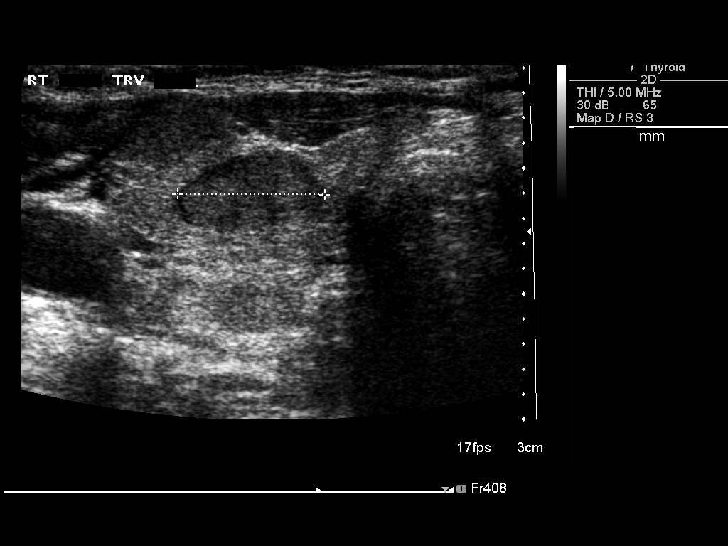
[im 22/44]
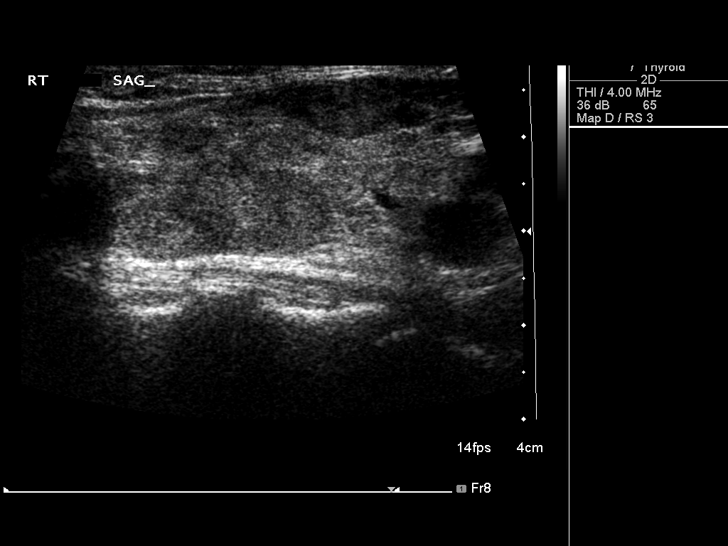
[im 26/44]
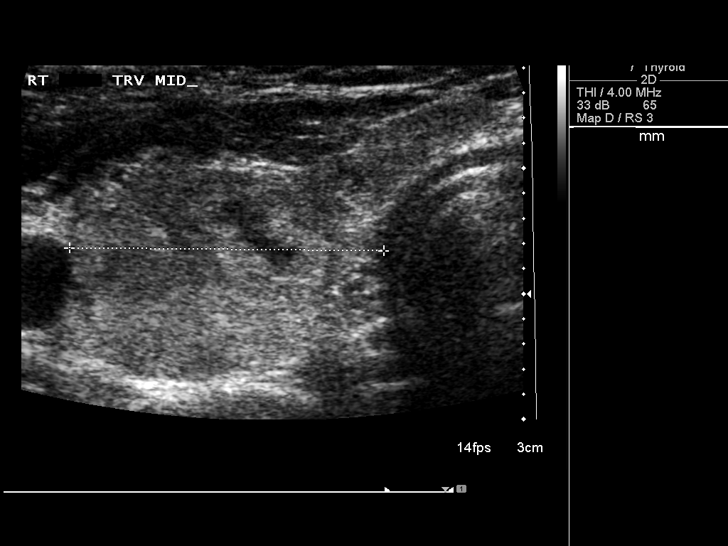
[im 29/44]
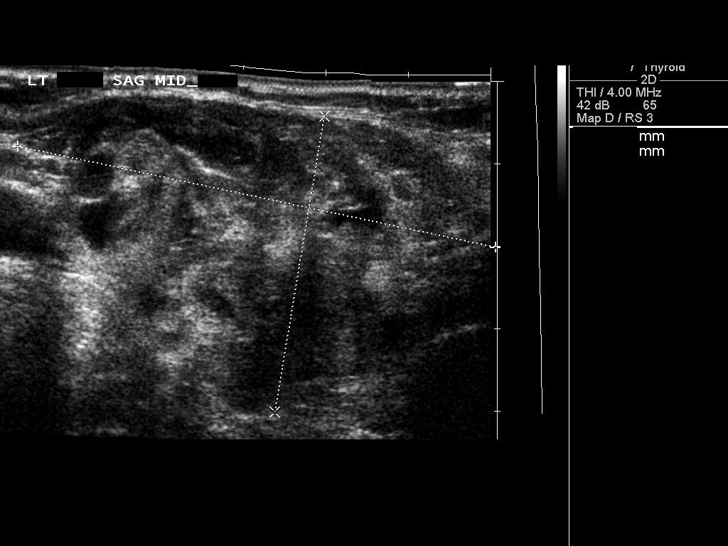
[im 33/44]
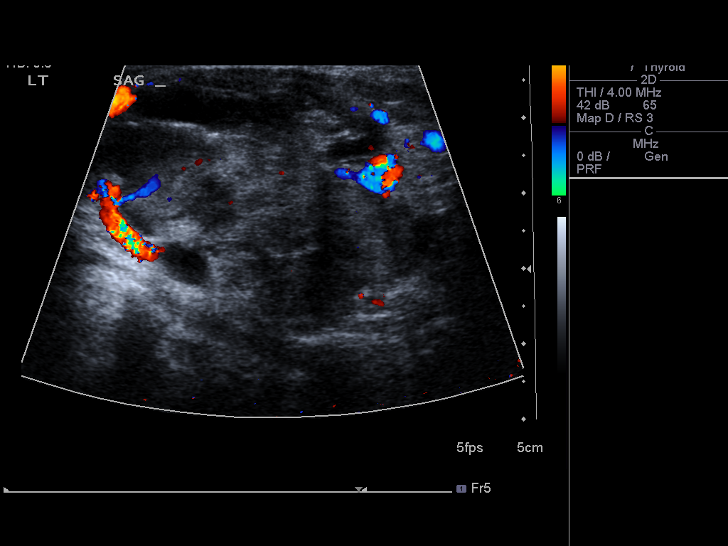
[im 36/44]
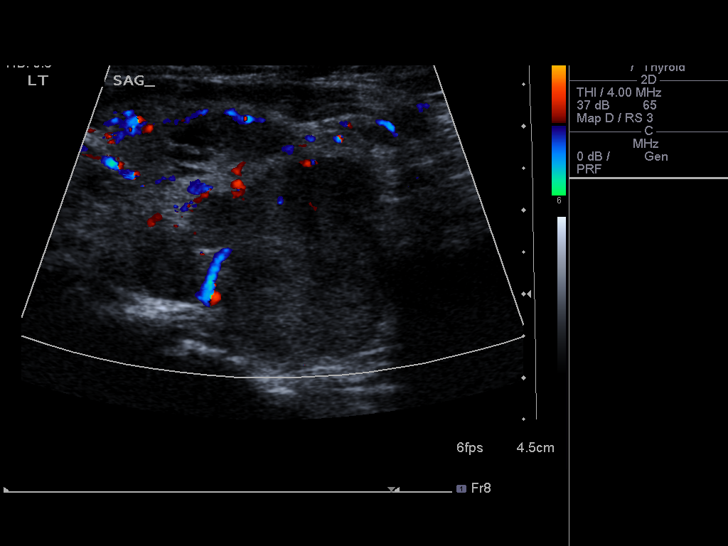
[im 40/44]
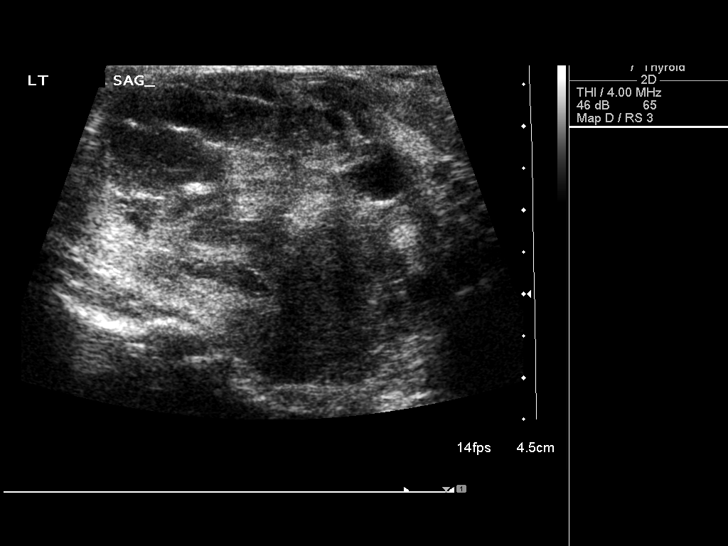
[im 44/44]
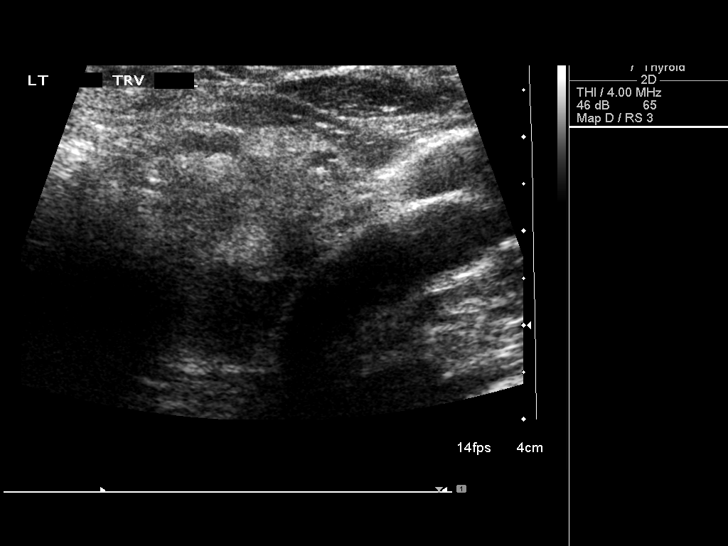

[13 of 25 positions shown; findings below may reference images not displayed]

FINDINGS: Right thyroid lobe

Measurements: 5.4 x 2.3 x 2.5 cm. Diffusely enlarged and
heterogeneous thyroid gland with multiple nodules and a pseudo
nodules.

1. Hypoechoic solid nodule in the superficial mid gland measures 14
x 0.6 x 1.0 cm
2. Suggestion of a complex solid nodule in the upper pole measuring
16 x 12 x 13 mm. This may in fact represent a pseudo nodule.
3. Hypoechoic solid nodule in the mid to lower gland measures 11 x 6
x 12 mm.
4. Adjacent hypoechoic solid nodule more inferiorly within the gland
measures 11 x 7 x 9 mm.
Left thyroid lobe

Measurements: 5.9 x 3.6 x 4.0 cm. Diffusely enlarged and
heterogeneous thyroid glands with multiple nodules.

1. Heterogeneous predominantly solid mass occupying the majority of
the mid and upper gland measures 48 x 32 x 36 mm.
2. Hypoechoic solid nodule in the lower gland measuring 29 x 25 x 24
mm.
Isthmus

Thickness: 0.2 cm. Small 4 mm hypoechoic nodule in the upper
isthmus.

Lymphadenopathy

None visualized.
IMPRESSION: 1. Diffusely enlarged, heterogeneous and multinodular thyroid gland.
The overall appearance is most consistent with multinodular goiter.
2. There are 2 dominant nodules in the left gland with the
dissimilar imaging characteristics. Both lesions meet consensus
criteria for ultrasound-guided biopsy. Findings meet consensus
criteria for biopsy. Ultrasound-guided fine needle aspiration should
be considered, as per the consensus statement: Management of Thyroid
Nodules Detected at US: Society of Radiologists in Ultrasound
3. Multiple small nodules throughout the right gland including a 16
mm nodule versus pseudo nodule in the upper pole. If a true nodule,
this lesion would also meet consensus criteria for biopsy. Recommend
sonographic evaluation in real-time at the time of a biopsy of the
left-sided thyroid nodules. If the 1.6 cm nodule proves to represent
a true nodule, biopsy of this lesion should also be considered.

## 2016-09-11 ENCOUNTER — Ambulatory Visit: Payer: Medicare Other | Admitting: Podiatry

## 2016-09-12 ENCOUNTER — Ambulatory Visit: Payer: Medicare Other | Admitting: Podiatry

## 2016-09-18 ENCOUNTER — Ambulatory Visit (INDEPENDENT_AMBULATORY_CARE_PROVIDER_SITE_OTHER): Payer: Medicare Other | Admitting: Podiatry

## 2016-09-18 DIAGNOSIS — L608 Other nail disorders: Secondary | ICD-10-CM

## 2016-09-18 DIAGNOSIS — B351 Tinea unguium: Secondary | ICD-10-CM

## 2016-09-18 DIAGNOSIS — M79609 Pain in unspecified limb: Secondary | ICD-10-CM | POA: Diagnosis not present

## 2016-09-18 DIAGNOSIS — L603 Nail dystrophy: Secondary | ICD-10-CM

## 2016-09-26 NOTE — Progress Notes (Signed)
   SUBJECTIVE Patient  presents to office today complaining of elongated, thickened nails. Pain while ambulating in shoes. Patient is unable to trim their own nails.   OBJECTIVE General Patient is awake, alert, and oriented x 3 and in no acute distress. Derm Skin is dry and supple bilateral. Negative open lesions or macerations. Remaining integument unremarkable. Nails are tender, long, thickened and dystrophic with subungual debris, consistent with onychomycosis, 1-5 bilateral. No signs of infection noted. Vasc  DP and PT pedal pulses palpable bilaterally. Temperature gradient within normal limits.  Neuro Epicritic and protective threshold sensation diminished bilaterally.  Musculoskeletal Exam No symptomatic pedal deformities noted bilateral. Muscular strength within normal limits.  ASSESSMENT 1. Onychodystrophic nails 1-5 bilateral with hyperkeratosis of nails.  2. Onychomycosis of nail due to dermatophyte bilateral 3. Pain in foot bilateral  PLAN OF CARE 1. Patient evaluated today.  2. Instructed to maintain good pedal hygiene and foot care.  3. Mechanical debridement of nails 1-5 bilaterally performed using a nail nipper. Filed with dremel without incident.  4. Return to clinic in 3 mos.    Brent M. Evans, DPM [facility]  Dr. Brent M. Evans, DPM    [address]                                        [place], [place] [address]                Office [phone]  Fax [phone]      

## 2016-10-02 DIAGNOSIS — H6123 Impacted cerumen, bilateral: Secondary | ICD-10-CM | POA: Insufficient documentation

## 2016-11-13 ENCOUNTER — Other Ambulatory Visit: Payer: Self-pay | Admitting: Otolaryngology

## 2016-11-13 DIAGNOSIS — E041 Nontoxic single thyroid nodule: Secondary | ICD-10-CM

## 2016-11-27 ENCOUNTER — Ambulatory Visit
Admission: RE | Admit: 2016-11-27 | Discharge: 2016-11-27 | Disposition: A | Discharge status: Home / Self Care | Payer: Medicare Other | Source: Ambulatory Visit | Attending: Otolaryngology | Admitting: Otolaryngology

## 2016-11-27 DIAGNOSIS — E041 Nontoxic single thyroid nodule: Secondary | ICD-10-CM

## 2016-12-18 ENCOUNTER — Ambulatory Visit (INDEPENDENT_AMBULATORY_CARE_PROVIDER_SITE_OTHER): Payer: Medicare Other | Admitting: Podiatry

## 2016-12-18 ENCOUNTER — Encounter: Payer: Self-pay | Admitting: Podiatry

## 2016-12-18 DIAGNOSIS — L608 Other nail disorders: Secondary | ICD-10-CM

## 2016-12-18 DIAGNOSIS — M79609 Pain in unspecified limb: Secondary | ICD-10-CM | POA: Diagnosis not present

## 2016-12-18 DIAGNOSIS — L603 Nail dystrophy: Secondary | ICD-10-CM

## 2016-12-18 DIAGNOSIS — B351 Tinea unguium: Secondary | ICD-10-CM

## 2016-12-20 NOTE — Progress Notes (Signed)
   SUBJECTIVE Patient  presents to office today complaining of elongated, thickened nails. Pain while ambulating in shoes. Patient is unable to trim their own nails.   OBJECTIVE General Patient is awake, alert, and oriented x 3 and in no acute distress. Derm Skin is dry and supple bilateral. Negative open lesions or macerations. Remaining integument unremarkable. Nails are tender, long, thickened and dystrophic with subungual debris, consistent with onychomycosis, 1-5 bilateral. No signs of infection noted. Vasc  DP and PT pedal pulses palpable bilaterally. Temperature gradient within normal limits.  Neuro Epicritic and protective threshold sensation diminished bilaterally.  Musculoskeletal Exam No symptomatic pedal deformities noted bilateral. Muscular strength within normal limits.  ASSESSMENT 1. Onychodystrophic nails 1-5 bilateral with hyperkeratosis of nails.  2. Onychomycosis of nail due to dermatophyte bilateral 3. Pain in foot bilateral  PLAN OF CARE 1. Patient evaluated today.  2. Instructed to maintain good pedal hygiene and foot care.  3. Mechanical debridement of nails 1-5 bilaterally performed using a nail nipper. Filed with dremel without incident.  4. Return to clinic in 3 mos.    Brent M. Evans, DPM [facility]  Dr. Brent M. Evans, DPM    [address]                                        [place], [place] [address]                Office [phone]  Fax [phone]      

## 2017-03-19 ENCOUNTER — Ambulatory Visit (INDEPENDENT_AMBULATORY_CARE_PROVIDER_SITE_OTHER): Payer: Medicare Other | Admitting: Podiatry

## 2017-03-19 DIAGNOSIS — B351 Tinea unguium: Secondary | ICD-10-CM

## 2017-03-19 DIAGNOSIS — M79676 Pain in unspecified toe(s): Secondary | ICD-10-CM

## 2017-03-21 ENCOUNTER — Encounter: Payer: Self-pay | Admitting: Internal Medicine

## 2017-03-22 NOTE — Progress Notes (Signed)
   SUBJECTIVE Patient  presents to office today complaining of elongated, thickened nails. Pain while ambulating in shoes. Patient is unable to trim their own nails.   OBJECTIVE General Patient is awake, alert, and oriented x 3 and in no acute distress. Derm Skin is dry and supple bilateral. Negative open lesions or macerations. Remaining integument unremarkable. Nails are tender, long, thickened and dystrophic with subungual debris, consistent with onychomycosis, 1-5 bilateral. No signs of infection noted. Vasc  DP and PT pedal pulses palpable bilaterally. Temperature gradient within normal limits.  Neuro Epicritic and protective threshold sensation diminished bilaterally.  Musculoskeletal Exam No symptomatic pedal deformities noted bilateral. Muscular strength within normal limits.  ASSESSMENT 1. Onychodystrophic nails 1-5 bilateral with hyperkeratosis of nails.  2. Onychomycosis of nail due to dermatophyte bilateral 3. Pain in foot bilateral  PLAN OF CARE 1. Patient evaluated today.  2. Instructed to maintain good pedal hygiene and foot care.  3. Mechanical debridement of nails 1-5 bilaterally performed using a nail nipper. Filed with dremel without incident.  4. Return to clinic in 3 mos.    Veronica Guerrant M. Arita Severtson, DPM [facility]  Dr. Eleshia Wooley M. Noorah Giammona, DPM    [address]                                        [place], [place] [address]                Office [phone]  Fax [phone]      

## 2017-05-21 ENCOUNTER — Encounter: Payer: Self-pay | Admitting: Podiatry

## 2017-05-21 ENCOUNTER — Ambulatory Visit (INDEPENDENT_AMBULATORY_CARE_PROVIDER_SITE_OTHER): Payer: Medicare Other | Admitting: Podiatry

## 2017-05-21 DIAGNOSIS — B351 Tinea unguium: Secondary | ICD-10-CM | POA: Diagnosis not present

## 2017-05-21 DIAGNOSIS — M79676 Pain in unspecified toe(s): Secondary | ICD-10-CM | POA: Diagnosis not present

## 2017-05-22 NOTE — Progress Notes (Signed)
   SUBJECTIVE Patient  presents to office today complaining of elongated, thickened nails. Pain while ambulating in shoes. Patient is unable to trim their own nails.   Past Medical History:  Diagnosis Date  . Cancer ([facility])    skin , place on left arm and nose  . Cerumen impaction 12/03/2011  . Chicken pox as a child  . Hearing loss 12/03/2011  . Hemorrhoids   . Hyperglycemia 02/06/2013  . Hypertension   . Internal hemorrhoids with bleeding and pain 04/13/2013  . Joint pain   . Measles as a child  . Mumps as a child  . Osteoarthritis of right hip 04/26/2013  . Overweight(278.02) 12/03/2011  . Preventative health care 12/03/2011  . Pulse fast   . SVT (supraventricular tachycardia) ([facility])    h/o    OBJECTIVE General Patient is awake, alert, and oriented x 3 and in no acute distress. Derm Skin is dry and supple bilateral. Negative open lesions or macerations. Remaining integument unremarkable. Nails are tender, long, thickened and dystrophic with subungual debris, consistent with onychomycosis, 1-5 bilateral. No signs of infection noted. Vasc  DP and PT pedal pulses palpable bilaterally. Temperature gradient within normal limits.  Neuro Epicritic and protective threshold sensation diminished bilaterally.  Musculoskeletal Exam No symptomatic pedal deformities noted bilateral. Muscular strength within normal limits.  ASSESSMENT 1. Onychodystrophic nails 1-5 bilateral with hyperkeratosis of nails.  2. Onychomycosis of nail due to dermatophyte bilateral 3. Pain in foot bilateral  PLAN OF CARE 1. Patient evaluated today.  2. Instructed to maintain good pedal hygiene and foot care.  3. Mechanical debridement of nails 1-5 bilaterally performed using a nail nipper. Filed with dremel without incident.  4. Return to clinic in 3 mos.    Edrick Kins, DPM [facility]  Dr. Edrick Kins, [place] [address]                                        [place], [place] [address]                 Office [phone]  Fax [phone]

## 2017-07-30 ENCOUNTER — Ambulatory Visit (INDEPENDENT_AMBULATORY_CARE_PROVIDER_SITE_OTHER): Payer: Medicare Other | Admitting: Podiatry

## 2017-07-30 ENCOUNTER — Encounter: Payer: Self-pay | Admitting: Podiatry

## 2017-07-30 DIAGNOSIS — M79676 Pain in unspecified toe(s): Secondary | ICD-10-CM | POA: Diagnosis not present

## 2017-07-30 DIAGNOSIS — L989 Disorder of the skin and subcutaneous tissue, unspecified: Secondary | ICD-10-CM

## 2017-07-30 DIAGNOSIS — B351 Tinea unguium: Secondary | ICD-10-CM | POA: Diagnosis not present

## 2017-08-03 NOTE — Progress Notes (Signed)
    Subjective: Patient is a 83 y.o. female presenting to the office today with a chief complaint of a painful callus lesion to the right foot that has been present for several months.  Patient also complains of elongated, thickened nails that cause pain while ambulating in shoes. Patient is unable to trim their own nails. Patient presents today for further treatment and evaluation.  Past Medical History:  Diagnosis Date  . Cancer ([place])    skin , place on left arm and nose  . Cerumen impaction 12/03/2011  . Chicken pox as a child  . Hearing loss 12/03/2011  . Hemorrhoids   . Hyperglycemia 02/06/2013  . Hypertension   . Internal hemorrhoids with bleeding and pain 04/13/2013  . Joint pain   . Measles as a child  . Mumps as a child  . Osteoarthritis of right hip 04/26/2013  . Overweight(278.02) 12/03/2011  . Preventative health care 12/03/2011  . Pulse fast   . SVT (supraventricular tachycardia) (HCC)    h/o    Objective:  Physical Exam General: Alert and oriented x3 in no acute distress  Dermatology: Hyperkeratotic lesion present on the right foot. Pain on palpation with a central nucleated core noted. Skin is warm, dry and supple bilateral lower extremities. Negative for open lesions or macerations. Nails are tender, long, thickened and dystrophic with subungual debris, consistent with onychomycosis, 1-5 bilateral. No signs of infection noted.  Vascular: Palpable pedal pulses bilaterally. No edema or erythema noted. Capillary refill within normal limits.  Neurological: Epicritic and protective threshold grossly intact bilaterally.   Musculoskeletal Exam: Pain on palpation at the keratotic lesion noted. Range of motion within normal limits bilateral. Muscle strength 5/5 in all groups bilateral.  Assessment: 1. Onychodystrophic nails 1-5 bilateral with hyperkeratosis of nails.  2. Onychomycosis of nail due to dermatophyte bilateral 3.  Porokeratosis to the right foot   Plan of Care:    #1 Patient evaluated. #2 Excisional debridement of keratoic lesion using a chisel blade was performed without incident.  #3 Dressed with light dressing. #4 Mechanical debridement of nails 1-5 bilaterally performed using a nail nipper. Filed with dremel without incident.  #5 Patient is to return to the clinic in 3 months.   Edrick Kins, DPM [facility]  Dr. Edrick Kins, [address]                                        [place], [place] [address]                Office [phone]  Fax [phone]

## 2017-09-24 ENCOUNTER — Ambulatory Visit (INDEPENDENT_AMBULATORY_CARE_PROVIDER_SITE_OTHER): Payer: Medicare Other | Admitting: Internal Medicine

## 2017-09-24 ENCOUNTER — Encounter: Payer: Self-pay | Admitting: Internal Medicine

## 2017-09-24 VITALS — BP 120/76 | HR 63 | Temp 98.0°F | Resp 10 | Ht 62.0 in | Wt 134.0 lb

## 2017-09-24 DIAGNOSIS — R413 Other amnesia: Secondary | ICD-10-CM

## 2017-09-24 DIAGNOSIS — Z136 Encounter for screening for cardiovascular disorders: Secondary | ICD-10-CM | POA: Diagnosis not present

## 2017-09-24 DIAGNOSIS — E041 Nontoxic single thyroid nodule: Secondary | ICD-10-CM | POA: Diagnosis not present

## 2017-09-24 DIAGNOSIS — H9193 Unspecified hearing loss, bilateral: Secondary | ICD-10-CM

## 2017-09-24 DIAGNOSIS — I10 Essential (primary) hypertension: Secondary | ICD-10-CM | POA: Diagnosis not present

## 2017-09-24 DIAGNOSIS — R32 Unspecified urinary incontinence: Secondary | ICD-10-CM

## 2017-09-24 NOTE — Patient Instructions (Addendum)
Continue current medications as ordered  Follow up with ENT as scheduled  Get old records  May need urology eval for bladder symptoms  Follow up in 1-2 mos for EV/AWV/ECG/MMSE. Fasting labs prior to appt

## 2017-09-24 NOTE — Progress Notes (Signed)
Patient ID: Sophia Hancock, female   DOB: [birth year], 84 y.o.   MRN: [id number]   Location:  [facility] OFFICE  Provider: DR Arletha Grippe  Code Status:  Goals of Care:  Advanced Directives 09/24/2017  Does Patient Have a Medical Advance Directive? No  Would patient like information on creating a medical advance directive? Yes (MAU/Ambulatory/Procedural Areas - Information given)     Chief Complaint  Patient presents with  . Establish Care    New patient establish care, patient is super sensitive to medication. Patient would like an evaluation of current medications. 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 Patient also c/o bladder control x long time (not certain of time frame). Patient also feels blue often. Patient with arthritis, currently controlling with advil and heating pad   . Medication Refill    No refills needed   . Advanced Directive    Advance Care Planning   . Medication Management    All medications at initial appointment     HPI: Patient is a 84 y.o. female seen today as a new pt. Family c/a worsening memory over the last several mos. She lives with daughter, son-in-law and adult grandson. She has left thyroid nodule and is followed by ENT Dr Erik Obey. Biopsy done in 2016 showed benign follicular cells. She has f/u thyroid US yearly. She is very HOH in R>L ear and wears hearing aids. No dysphagia but wonders if size is larger  Daughter c/a short term memory loss, bladder incontinence (at night she stands up from lying down and has urine leakage), intermittent depression (TSH 2.58 in Jan 2018). No SI/HI  HTN - BP stable on 3 meds  Pt is sensitive to medications and prefers not to take anything new at this time  Past Medical History:  Diagnosis Date  . Cancer ([place])    skin , place on left arm and nose  . Cerumen impaction 12/03/2011  .  Chicken pox as a child  . Hearing loss 12/03/2011  . Hemorrhoids   . Hyperglycemia 02/06/2013  . Hypertension   . Internal hemorrhoids with bleeding and pain 04/13/2013  . Joint pain   . Measles as a child  . Mumps as a child  . Osteoarthritis of right hip 04/26/2013  . Overweight(278.02) 12/03/2011  . Preventative health care 12/03/2011  . Pulse fast   . SVT (supraventricular tachycardia) (HCC)    h/o  . Thyroid disorder     Past Surgical History:  Procedure Laterality Date  . BREAST SURGERY     benign X 2, b/l  . CATARACT EXTRACTION, BILATERAL     one eye was done twice  . DILATION AND CURETTAGE OF UTERUS    . TONSILLECTOMY       reports that  has never smoked. she has never used smokeless tobacco. She reports that she does not drink alcohol or use drugs. Social History   Socioeconomic History  . Marital status: Divorced    Spouse name: Not on file  . Number of children: Not on file  . Years of education: Not on file  . Highest education level: Not on file  Social Needs  . Financial resource strain: Not on file  . Food insecurity - worry: Not on file  . Food insecurity - inability: Not on file  . Transportation needs - medical: Not on file  .  Transportation needs - non-medical: Not on file  Occupational History  . Not on file  Tobacco Use  . Smoking status: Never Smoker  . Smokeless tobacco: Never Used  Substance and Sexual Activity  . Alcohol use: No  . Drug use: No  . Sexual activity: No  Other Topics Concern  . Not on file  Social History Narrative   Social History      Diet? ?      Do you drink/eat things with caffeine? 1 cup of coffee per day      Marital status?             divorced                       What year were you married? 1959      Do you live in a house, apartment, assisted living, condo, trailer, etc.? house      Is it one or more stories? 2      How many persons live in your home? 3-4 when grandson is at home      Do you have any pets in  your home? (please list) none       Highest level of education completed?      Current or past profession: retired: [employer] officer, museum, music and school teacher      Do you exercise?             no                         Type & how often?      Advanced Directives      Do you have a living will? no      Do you have a DNR form?      no                             If not, do you want to discuss one? yes      Do you have signed POA/HPOA for forms? no      Functional Status      Do you have difficulty bathing or dressing yourself?      Do you have difficulty preparing food or eating?       Do you have difficulty managing your medications?      Do you have difficulty managing your finances?      Do you have difficulty affording your medications?    Family History  Problem Relation Age of Onset  . Hypertension Mother   . Anemia Mother   . Kidney disease Mother   . Arthritis Father        severe  . Aneurysm Sister        aortic  . Breast cancer Sister   . Heart disease Sister        aortic aneurysm  . Kidney disease Sister        dialysis  . COPD Sister   . Aneurysm Brother        adbominal  . Aortic aneurysm Brother   . Stroke Paternal Grandfather   . Cholecystitis Daughter     Allergies  Allergen Reactions  . Tylenol [Acetaminophen]     Pt reports being very sensitive to all pain medications, including OTC pain medications. Pt reports feeling swimmy headed, knocked out, or extremely nervous when she has taken pain  medications.     Outpatient Encounter Medications as of 09/24/2017  Medication Sig  . hydrochlorothiazide (MICROZIDE) 12.5 MG capsule TAKE ONE CAPSULE BY MOUTH EVERY MORNING  . ibuprofen (ADVIL,MOTRIN) 200 MG tablet Take 200 mg by mouth every 6 (six) hours as needed.  Marland Kitchen lisinopril (PRINIVIL,ZESTRIL) 10 MG tablet TAKE 1/2 TABLET BY MOUTH TWICE A DAY  . metoprolol succinate (TOPROL-XL) 25 MG 24 hr tablet Take 12.5 mg by mouth 2 (two) times daily.   . [DISCONTINUED] metoprolol succinate (TOPROL-XL) 25 MG 24 hr tablet Take 1 tablet (25 mg total) by mouth daily.   No facility-administered encounter medications on file as of 09/24/2017.     Review of Systems:  Review of Systems  HENT: Positive for hearing loss.   Eyes: Positive for visual disturbance.       Dry  Gastrointestinal: Positive for diarrhea.  Genitourinary:       Incontinence  Musculoskeletal: Positive for arthralgias (with joint stiffness).  Skin:       dry  Hematological: Bruises/bleeds easily.  Psychiatric/Behavioral:       Memory loss  All other systems reviewed and are negative. per NP packet  Health Maintenance  Topic Date Due  . DEXA SCAN  12/15/1994  . TETANUS/TDAP  08/26/2020  . INFLUENZA VACCINE  Completed  . PNA vac Low Risk Adult  Completed    Physical Exam: Vitals:   09/24/17 1050  BP: 120/76  Pulse: 63  Resp: 10  Temp: 98 F (36.7 C)  TempSrc: Oral  SpO2: 99%  Weight: 134 lb (60.8 kg)  Height: 5\' 2"  (1.575 m)   Body mass index is 24.51 kg/m. Physical Exam  Constitutional: She appears well-developed and well-nourished.  Very HOH  HENT:  Mouth/Throat: Oropharynx is clear and moist. No oropharyngeal exudate.  MMM; no oral thrush  Eyes: Pupils are equal, round, and reactive to light. No scleral icterus.  Neck: Neck supple. No muscular tenderness present. Carotid bruit is not present. No tracheal deviation and normal range of motion present. Thyromegaly (multiple nodules) present.    Cardiovascular: Normal rate, regular rhythm and intact distal pulses. Exam reveals no gallop and no friction rub.  Murmur (1/6 SEM) heard. No LE edema b/l. no calf TTP.   Pulmonary/Chest: Effort normal and breath sounds normal. No stridor. No respiratory distress. She has no wheezes. She has no rales.  Abdominal: Soft. Normal appearance and bowel sounds are normal. She exhibits no distension and no mass. There is no hepatomegaly. There is no tenderness.  There is no rigidity, no rebound and no guarding. No hernia.  Musculoskeletal: She exhibits deformity (thoracic kyphosis, mild).  Lymphadenopathy:    She has no cervical adenopathy.  Neurological: She is alert.  Skin: Skin is warm and dry. No rash noted.  Psychiatric: She has a normal mood and affect. Her behavior is normal.    Labs reviewed: Basic Metabolic Panel: No results for input(s): NA, K, CL, CO2, GLUCOSE, BUN, CREATININE, CALCIUM, MG, PHOS, TSH in the last 8760 hours. Liver Function Tests: No results for input(s): AST, ALT, ALKPHOS, BILITOT, PROT, ALBUMIN in the last 8760 hours. No results for input(s): LIPASE, AMYLASE in the last 8760 hours. No results for input(s): AMMONIA in the last 8760 hours. CBC: No results for input(s): WBC, NEUTROABS, HGB, HCT, MCV, PLT in the last 8760 hours. Lipid Panel: No results for input(s): CHOL, HDL, LDLCALC, TRIG, CHOLHDL, LDLDIRECT in the last 8760 hours. Lab Results  Component Value Date   HGBA1C 6.0 (H) 12/29/2013  Procedures since last visit: No results found.  Assessment/Plan   ICD-10-CM   1. Thyroid nodule E04.1    left  2. Essential hypertension I10   3. Urinary incontinence, unspecified type R32   4. Short-term memory loss R41.3   5. Bilateral hearing loss, unspecified hearing loss type H91.93    Continue current medications as ordered  Follow up with ENT as scheduled  Get old records  May need urology eval for bladder symptoms  Follow up in 1-2 mos for EV/AWV/ECG/MMSE. Fasting labs prior to appt     [place] S. Perlie Gold  [facility] [address] [place], [place] [address] [phone] Cell (Monday-Friday 8 AM - 5 PM) [phone] After 5 PM and follow prompts

## 2017-09-26 ENCOUNTER — Encounter: Payer: Self-pay | Admitting: Internal Medicine

## 2017-09-26 DIAGNOSIS — E041 Nontoxic single thyroid nodule: Secondary | ICD-10-CM | POA: Insufficient documentation

## 2017-09-26 DIAGNOSIS — R413 Other amnesia: Secondary | ICD-10-CM | POA: Insufficient documentation

## 2017-09-26 DIAGNOSIS — R32 Unspecified urinary incontinence: Secondary | ICD-10-CM | POA: Insufficient documentation

## 2017-10-01 ENCOUNTER — Encounter: Payer: Self-pay | Admitting: Podiatry

## 2017-10-01 ENCOUNTER — Ambulatory Visit (INDEPENDENT_AMBULATORY_CARE_PROVIDER_SITE_OTHER): Payer: Medicare Other | Admitting: Podiatry

## 2017-10-01 DIAGNOSIS — B351 Tinea unguium: Secondary | ICD-10-CM

## 2017-10-01 DIAGNOSIS — L989 Disorder of the skin and subcutaneous tissue, unspecified: Secondary | ICD-10-CM

## 2017-10-01 DIAGNOSIS — M79676 Pain in unspecified toe(s): Secondary | ICD-10-CM | POA: Diagnosis not present

## 2017-10-02 ENCOUNTER — Encounter: Payer: Self-pay | Admitting: Psychiatric/Mental Health

## 2017-10-05 NOTE — Progress Notes (Signed)
    Subjective: Patient is a 84 y.o. female presenting to the office today with a chief complaint of a painful callus lesion to the right foot that has been present for several months.  Patient also complains of elongated, thickened nails that cause pain while ambulating in shoes. Patient is unable to trim their own nails. Patient presents today for further treatment and evaluation.  Past Medical History:  Diagnosis Date  . Arthritis    per Dermatologist records  . Basal cell carcinoma    per Dermatologist records  . Cancer ([place])    skin , place on left arm and nose  . Cerumen impaction 12/03/2011  . Chicken pox as a child  . Hearing loss 12/03/2011  . Hemorrhoids   . Hyperglycemia 02/06/2013  . Hypertension   . Internal hemorrhoids with bleeding and pain 04/13/2013  . Joint pain   . Measles as a child  . Mumps as a child  . Osteoarthritis of right hip 04/26/2013  . Overweight(278.02) 12/03/2011  . Preventative health care 12/03/2011  . Pulse fast   . SVT (supraventricular tachycardia) (HCC)    h/o  . Thyroid disorder     Objective:  Physical Exam General: Alert and oriented x3 in no acute distress  Dermatology: Hyperkeratotic lesion present on the right foot. Pain on palpation with a central nucleated core noted. Skin is warm, dry and supple bilateral lower extremities. Negative for open lesions or macerations. Nails are tender, long, thickened and dystrophic with subungual debris, consistent with onychomycosis, 1-5 bilateral. No signs of infection noted.  Vascular: Palpable pedal pulses bilaterally. No edema or erythema noted. Capillary refill within normal limits.  Neurological: Epicritic and protective threshold grossly intact bilaterally.   Musculoskeletal Exam: Pain on palpation at the keratotic lesion noted. Range of motion within normal limits bilateral. Muscle strength 5/5 in all groups bilateral.  Assessment: 1. Onychodystrophic nails 1-5 bilateral with hyperkeratosis of  nails.  2. Onychomycosis of nail due to dermatophyte bilateral 3.  Porokeratosis to the right foot   Plan of Care:  #1 Patient evaluated. #2 Excisional debridement of keratoic lesion using a chisel blade was performed without incident.  #3 Dressed with light dressing. #4 Mechanical debridement of nails 1-5 bilaterally performed using a nail nipper. Filed with dremel without incident.  #5 Patient is to return to the clinic in 3 months.   Edrick Kins, DPM [facility]  Dr. Edrick Kins, [place]                                        [place], [place] [address]                Office [phone]  Fax [phone]

## 2017-10-06 DIAGNOSIS — H9113 Presbycusis, bilateral: Secondary | ICD-10-CM | POA: Insufficient documentation

## 2017-10-26 DIAGNOSIS — I10 Essential (primary) hypertension: Secondary | ICD-10-CM | POA: Insufficient documentation

## 2017-10-26 DIAGNOSIS — N3941 Urge incontinence: Secondary | ICD-10-CM | POA: Insufficient documentation

## 2017-10-26 DIAGNOSIS — R413 Other amnesia: Secondary | ICD-10-CM | POA: Insufficient documentation

## 2017-10-31 ENCOUNTER — Ambulatory Visit: Payer: Medicare Other

## 2017-11-04 ENCOUNTER — Encounter: Payer: Medicare Other | Admitting: Internal Medicine

## 2017-11-19 ENCOUNTER — Other Ambulatory Visit: Payer: Self-pay | Admitting: Otolaryngology

## 2017-11-19 DIAGNOSIS — E041 Nontoxic single thyroid nodule: Secondary | ICD-10-CM

## 2017-12-08 ENCOUNTER — Ambulatory Visit (INDEPENDENT_AMBULATORY_CARE_PROVIDER_SITE_OTHER): Payer: Medicare Other | Admitting: Podiatry

## 2017-12-08 ENCOUNTER — Encounter: Payer: Self-pay | Admitting: Podiatry

## 2017-12-08 DIAGNOSIS — M79676 Pain in unspecified toe(s): Secondary | ICD-10-CM | POA: Diagnosis not present

## 2017-12-08 DIAGNOSIS — B351 Tinea unguium: Secondary | ICD-10-CM

## 2017-12-11 DIAGNOSIS — F0280 Dementia in other diseases classified elsewhere without behavioral disturbance: Secondary | ICD-10-CM

## 2017-12-11 DIAGNOSIS — F0390 Unspecified dementia without behavioral disturbance: Secondary | ICD-10-CM | POA: Insufficient documentation

## 2017-12-11 DIAGNOSIS — G309 Alzheimer's disease, unspecified: Secondary | ICD-10-CM

## 2017-12-12 NOTE — Progress Notes (Signed)
   SUBJECTIVE Patient presents to office today complaining of elongated, thickened nails that cause pain while ambulating in shoes. She is unable to trim her own nails. Patient is here for further evaluation and treatment.  Past Medical History:  Diagnosis Date  . Arthritis    per Dermatologist records  . Basal cell carcinoma    per Dermatologist records  . Cancer (HCC)    skin , place on left arm and nose  . Cerumen impaction 12/03/2011  . Chicken pox as a child  . Hearing loss 12/03/2011  . Hemorrhoids   . Hyperglycemia 02/06/2013  . Hypertension   . Internal hemorrhoids with bleeding and pain 04/13/2013  . Joint pain   . Measles as a child  . Mumps as a child  . Osteoarthritis of right hip 04/26/2013  . Overweight(278.02) 12/03/2011  . Preventative health care 12/03/2011  . Pulse fast   . SVT (supraventricular tachycardia) (HCC)    h/o  . Thyroid disorder     OBJECTIVE General Patient is awake, alert, and oriented x 3 and in no acute distress. Derm Skin is dry and supple bilateral. Negative open lesions or macerations. Remaining integument unremarkable. Nails are tender, long, thickened and dystrophic with subungual debris, consistent with onychomycosis, 1-5 bilateral. No signs of infection noted. Vasc  DP and PT pedal pulses palpable bilaterally. Temperature gradient within normal limits.  Neuro Epicritic and protective threshold sensation grossly intact bilaterally.  Musculoskeletal Exam No symptomatic pedal deformities noted bilateral. Muscular strength within normal limits.  ASSESSMENT 1. Onychodystrophic nails 1-5 bilateral with hyperkeratosis of nails.  2. Onychomycosis of nail due to dermatophyte bilateral 3. Pain in foot bilateral  PLAN OF CARE 1. Patient evaluated today.  2. Instructed to maintain good pedal hygiene and foot care.  3. Mechanical debridement of nails 1-5 bilaterally performed using a nail nipper. Filed with dremel without incident.  4. Return to clinic  in 3 mos.    Brent M. Evans, DPM [facility]  Dr. Brent M. Evans, DPM    [address]                                        , [place] [address]                Office [phone]  Fax [phone]     

## 2017-12-22 ENCOUNTER — Ambulatory Visit
Admission: RE | Admit: 2017-12-22 | Discharge: 2017-12-22 | Disposition: A | Discharge status: Home / Self Care | Payer: Medicare Other | Source: Ambulatory Visit | Attending: Otolaryngology | Admitting: Otolaryngology

## 2017-12-22 DIAGNOSIS — E041 Nontoxic single thyroid nodule: Secondary | ICD-10-CM

## 2018-02-25 ENCOUNTER — Ambulatory Visit (INDEPENDENT_AMBULATORY_CARE_PROVIDER_SITE_OTHER): Payer: Medicare Other | Admitting: Podiatry

## 2018-02-25 ENCOUNTER — Encounter: Payer: Self-pay | Admitting: Podiatry

## 2018-02-25 DIAGNOSIS — M79674 Pain in right toe(s): Secondary | ICD-10-CM | POA: Diagnosis not present

## 2018-02-25 DIAGNOSIS — M79675 Pain in left toe(s): Secondary | ICD-10-CM

## 2018-02-25 DIAGNOSIS — B351 Tinea unguium: Secondary | ICD-10-CM

## 2018-03-01 NOTE — Progress Notes (Addendum)
Subjective: Sophia Hancock is an 84 yo WF who presents today with cc of painful, discolored, thick toenails which interfere with activities of daily living. Pain is aggravated when wearing enclosed shoe gear. Pain is getting progressively worse and relieved with periodic professional debridement.  Objective: There were no vitals filed for this visit. Vascular Examination: Capillary refill time immediate x 10 digits Dorsalis pedis and posterior tibial pulses present b/l No digital hair x 10 digits Skin temperature gradient WNL b/l  Dermatological Examination: Skin with normal turgor, texture and tone b/l  Toenails 1-5 b/l discolored, thick, dystrophic with subungual debris and pain with palpation to nailbeds due to thickness of nails.  Musculoskeletal: Muscle strength 5/5 to all LE muscle groups  Neurological: Sensation intact with 10 gram monofilament. Vibratory sensation intact.  Assessment: Painful onychomycosis toenails 1-5 b/l   Plan: 1. Toenails 1-5 b/l were debrided in length and girth. 2. Patient to continue soft, supportive shoe gear 3. Patient to report any pedal injuries to medical professional immediately. 4. Follow up 3 months.  5. Patient/POA to call should there be a concern in the interim.

## 2018-03-09 ENCOUNTER — Ambulatory Visit: Payer: Medicare Other | Admitting: Podiatry

## 2018-05-01 ENCOUNTER — Ambulatory Visit: Payer: Medicare Other | Admitting: Podiatry

## 2018-05-04 ENCOUNTER — Ambulatory Visit (INDEPENDENT_AMBULATORY_CARE_PROVIDER_SITE_OTHER): Payer: Medicare Other | Admitting: Podiatry

## 2018-05-04 DIAGNOSIS — B351 Tinea unguium: Secondary | ICD-10-CM

## 2018-05-04 DIAGNOSIS — M79674 Pain in right toe(s): Secondary | ICD-10-CM

## 2018-05-04 DIAGNOSIS — M79675 Pain in left toe(s): Secondary | ICD-10-CM

## 2018-05-06 NOTE — Progress Notes (Signed)
   SUBJECTIVE Patient presents to office today complaining of elongated, thickened nails that cause pain while ambulating in shoes. She is unable to trim her own nails. Patient is here for further evaluation and treatment.  Past Medical History:  Diagnosis Date  . Arthritis    per Dermatologist records  . Basal cell carcinoma    per Dermatologist records  . Cancer ([place])    skin , place on left arm and nose  . Cerumen impaction 12/03/2011  . Chicken pox as a child  . Hearing loss 12/03/2011  . Hemorrhoids   . Hyperglycemia 02/06/2013  . Hypertension   . Internal hemorrhoids with bleeding and pain 04/13/2013  . Joint pain   . Measles as a child  . Mumps as a child  . Osteoarthritis of right hip 04/26/2013  . Overweight(278.02) 12/03/2011  . Preventative health care 12/03/2011  . Pulse fast   . SVT (supraventricular tachycardia) (HCC)    h/o  . Thyroid disorder     OBJECTIVE General Patient is awake, alert, and oriented x 3 and in no acute distress. Derm Skin is dry and supple bilateral. Negative open lesions or macerations. Remaining integument unremarkable. Nails are tender, long, thickened and dystrophic with subungual debris, consistent with onychomycosis, 1-5 bilateral. No signs of infection noted. Vasc  DP and PT pedal pulses palpable bilaterally. Temperature gradient within normal limits.  Neuro Epicritic and protective threshold sensation grossly intact bilaterally.  Musculoskeletal Exam No symptomatic pedal deformities noted bilateral. Muscular strength within normal limits.  ASSESSMENT 1. Onychodystrophic nails 1-5 bilateral with hyperkeratosis of nails.  2. Onychomycosis of nail due to dermatophyte bilateral 3. Pain in foot bilateral  PLAN OF CARE 1. Patient evaluated today.  2. Instructed to maintain good pedal hygiene and foot care.  3. Mechanical debridement of nails 1-5 bilaterally performed using a nail nipper. Filed with dremel without incident.  4. Return to clinic  in 3 mos.    Edrick Kins, DPM [facility]  Dr. Edrick Kins, [place]                                        [place], [place] [address]                Office [phone]  Fax [phone]

## 2018-06-29 ENCOUNTER — Ambulatory Visit (INDEPENDENT_AMBULATORY_CARE_PROVIDER_SITE_OTHER): Payer: Medicare Other | Admitting: Podiatry

## 2018-06-29 DIAGNOSIS — B351 Tinea unguium: Secondary | ICD-10-CM | POA: Diagnosis not present

## 2018-06-29 DIAGNOSIS — M79675 Pain in left toe(s): Secondary | ICD-10-CM

## 2018-06-29 DIAGNOSIS — M79674 Pain in right toe(s): Secondary | ICD-10-CM

## 2018-06-29 DIAGNOSIS — L989 Disorder of the skin and subcutaneous tissue, unspecified: Secondary | ICD-10-CM

## 2018-06-29 NOTE — Patient Instructions (Signed)

## 2018-06-30 NOTE — Progress Notes (Signed)
    Subjective: Patient is a 84 y.o. female presenting to the office today with a chief complaint of a painful callus lesion to the left third toe that has been present for several weeks. Bearing weight and wearing shoes increases the pain. She has not done anything at home for treatment.   Patient also complains of elongated, thickened nails that cause pain while ambulating in shoes. She is unable to trim her own nails. Patient presents today for further treatment and evaluation.  Past Medical History:  Diagnosis Date  . Arthritis    per Dermatologist records  . Basal cell carcinoma    per Dermatologist records  . Cancer ([place])    skin , place on left arm and nose  . Cerumen impaction 12/03/2011  . Chicken pox as a child  . Hearing loss 12/03/2011  . Hemorrhoids   . Hyperglycemia 02/06/2013  . Hypertension   . Internal hemorrhoids with bleeding and pain 04/13/2013  . Joint pain   . Measles as a child  . Mumps as a child  . Osteoarthritis of right hip 04/26/2013  . Overweight(278.02) 12/03/2011  . Preventative health care 12/03/2011  . Pulse fast   . SVT (supraventricular tachycardia) (HCC)    h/o  . Thyroid disorder     Objective:  Physical Exam General: Alert and oriented x3 in no acute distress  Dermatology: Hyperkeratotic lesion present on the left 3rd toe. Pain on palpation with a central nucleated core noted. Skin is warm, dry and supple bilateral lower extremities. Negative for open lesions or macerations. Nails are tender, long, thickened and dystrophic with subungual debris, consistent with onychomycosis, 1-5 bilateral. No signs of infection noted.  Vascular: Palpable pedal pulses bilaterally. No edema or erythema noted. Capillary refill within normal limits.  Neurological: Epicritic and protective threshold grossly intact bilaterally.   Musculoskeletal Exam: Pain on palpation at the keratotic lesion noted. Range of motion within normal limits bilateral. Muscle strength 5/5 in  all groups bilateral.  Assessment: 1. Onychodystrophic nails 1-5 bilateral with hyperkeratosis of nails.  2. Onychomycosis of nail due to dermatophyte bilateral 3. Porokeratosis noted to the left third toe   Plan of Care:  1. Patient evaluated. 2. Excisional debridement of keratoic lesion using a chisel blade was performed without incident.  3. Dressed with light dressing. 4. Mechanical debridement of nails 1-5 bilaterally performed using a nail nipper. Filed with dremel without incident.  5. Continue using silicone toe caps.  6. Patient is to return to the clinic in 3 months.   Edrick Kins, DPM [facility]  Dr. Edrick Kins, [place]                                        [place], [place] [address]                Office [phone]  Fax [phone]

## 2018-08-03 ENCOUNTER — Ambulatory Visit: Payer: Medicare Other | Admitting: Podiatry

## 2018-08-20 ENCOUNTER — Encounter: Payer: Self-pay | Admitting: Podiatry

## 2018-08-20 ENCOUNTER — Ambulatory Visit (INDEPENDENT_AMBULATORY_CARE_PROVIDER_SITE_OTHER): Payer: Medicare Other | Admitting: Podiatry

## 2018-08-20 DIAGNOSIS — Q828 Other specified congenital malformations of skin: Secondary | ICD-10-CM | POA: Diagnosis not present

## 2018-08-20 NOTE — Patient Instructions (Signed)

## 2018-08-20 NOTE — Progress Notes (Signed)
Subjective:  Patient presents to clinic with cc of painful corn left foot, 3rd digit,  which is aggravated when weightbearing with and without shoe gear.  Ms. Sophia Hancock had lesion debrided last month, but it has recurred and patient is complaining of pain. She has what is described as toe crest, but she doesn't always wear it as sometimes Ms. Sophia Hancock dresses herself before family can assist her.   This pain limits her daily activities per daughter's report. Pain symptoms resolve with periodic professional debridement.  Corrington, Kip A, MD is her PCP and last visit was 10/24/2017.   Current Outpatient Medications:  .  hydrochlorothiazide (MICROZIDE) 12.5 MG capsule, TAKE ONE CAPSULE BY MOUTH EVERY MORNING, Disp: 30 capsule, Rfl: 0 .  ibuprofen (ADVIL,MOTRIN) 200 MG tablet, Take 200 mg by mouth every 6 (six) hours as needed., Disp: , Rfl:  .  lisinopril (PRINIVIL,ZESTRIL) 10 MG tablet, TAKE 1/2 TABLET BY MOUTH TWICE A DAY, Disp: 30 tablet, Rfl: 0 .  metoprolol succinate (TOPROL-XL) 25 MG 24 hr tablet, Take 12.5 mg by mouth 2 (two) times daily., Disp: , Rfl:    Allergies  Allergen Reactions  . Tylenol [Acetaminophen]     Pt reports being very sensitive to all pain medications, including OTC pain medications. Pt reports feeling swimmy headed, knocked out, or extremely nervous when she has taken pain medications.     Objective:  Physical Examination:  Neurovascular status intact b/l feet.  Muscle strength 5/5 to all muscle groups b/l LE  Porokeratotic lesion noted distal tip of left 3rd digit with tenderness to palpation. No erythema, no edema, no drainage, no flocculence.  Assessment: Painful porokeratotic lesion distal tip left 3rd digit  Plan: 1. Painful porokeratotoc lesion pared/enucleated distal tip left 3rd digit without bleeding. Dispensed silicone toe cap. 2. Continue soft, supportive shoe gear daily. 3. Report any pedal injuries to medical professional 4. Keep February  appointment which is already scheduled. 5. Call our office should there be any questions/concerns in the interim.

## 2018-09-28 ENCOUNTER — Ambulatory Visit (INDEPENDENT_AMBULATORY_CARE_PROVIDER_SITE_OTHER): Payer: Medicare Other | Admitting: Podiatry

## 2018-09-28 DIAGNOSIS — M79676 Pain in unspecified toe(s): Secondary | ICD-10-CM | POA: Diagnosis not present

## 2018-09-28 DIAGNOSIS — B351 Tinea unguium: Secondary | ICD-10-CM | POA: Diagnosis not present

## 2018-09-28 DIAGNOSIS — L989 Disorder of the skin and subcutaneous tissue, unspecified: Secondary | ICD-10-CM | POA: Diagnosis not present

## 2018-10-01 NOTE — Progress Notes (Signed)
    Subjective: Patient is a 85 y.o. female presenting to the office today for follow up evaluation of a painful callus lesion to the left third toe. She has not done anything at home for treatment. Walking and applying pressure to the area increases the pain.  Patient also complains of elongated, thickened nails that cause pain while ambulating in shoes. She is unable to trim her own nails. Patient presents today for further treatment and evaluation.  Past Medical History:  Diagnosis Date  . Arthritis    per Dermatologist records  . Basal cell carcinoma    per Dermatologist records  . Cancer ([place])    skin , place on left arm and nose  . Cerumen impaction 12/03/2011  . Chicken pox as a child  . Hearing loss 12/03/2011  . Hemorrhoids   . Hyperglycemia 02/06/2013  . Hypertension   . Internal hemorrhoids with bleeding and pain 04/13/2013  . Joint pain   . Measles as a child  . Mumps as a child  . Osteoarthritis of right hip 04/26/2013  . Overweight(278.02) 12/03/2011  . Preventative health care 12/03/2011  . Pulse fast   . SVT (supraventricular tachycardia) (HCC)    h/o  . Thyroid disorder     Objective:  Physical Exam General: Alert and oriented x3 in no acute distress  Dermatology: Hyperkeratotic lesion present on the left 3rd toe. Pain on palpation with a central nucleated core noted. Skin is warm, dry and supple bilateral lower extremities. Negative for open lesions or macerations. Nails are tender, long, thickened and dystrophic with subungual debris, consistent with onychomycosis, 1-5 bilateral. No signs of infection noted.  Vascular: Palpable pedal pulses bilaterally. No edema or erythema noted. Capillary refill within normal limits.  Neurological: Epicritic and protective threshold grossly intact bilaterally.   Musculoskeletal Exam: Pain on palpation at the keratotic lesion noted. Range of motion within normal limits bilateral. Muscle strength 5/5 in all groups  bilateral.  Assessment: 1. Onychodystrophic nails 1-5 bilateral with hyperkeratosis of nails.  2. Onychomycosis of nail due to dermatophyte bilateral 3. Pre-ulcerative callus lesion noted to the left 3rd toe   Plan of Care:  1. Patient evaluated. 2. Excisional debridement of keratoic lesion using a chisel blade was performed without incident.  3. Dressed with light dressing. 4. Mechanical debridement of nails 1-5 bilaterally performed using a nail nipper. Filed with dremel without incident.  5. Patient is to return to the clinic in 3 months.   Edrick Kins, DPM [facility]  Dr. Edrick Kins, [address]                                        [place], [place] [address]                Office [phone]  Fax [phone]

## 2018-12-28 ENCOUNTER — Ambulatory Visit: Payer: Medicare Other | Admitting: Podiatry

## 2019-01-23 ENCOUNTER — Other Ambulatory Visit: Payer: Self-pay

## 2019-01-23 ENCOUNTER — Encounter (HOSPITAL_COMMUNITY): Payer: Self-pay | Admitting: *Deleted

## 2019-01-23 ENCOUNTER — Emergency Department (HOSPITAL_COMMUNITY)
Admission: EM | Admit: 2019-01-23 | Discharge: 2019-01-24 | Disposition: A | Discharge status: Home / Self Care | Payer: Medicare Other | Attending: Emergency Medicine | Admitting: Emergency Medicine

## 2019-01-23 DIAGNOSIS — F0280 Dementia in other diseases classified elsewhere without behavioral disturbance: Secondary | ICD-10-CM | POA: Diagnosis not present

## 2019-01-23 DIAGNOSIS — Z85828 Personal history of other malignant neoplasm of skin: Secondary | ICD-10-CM | POA: Diagnosis not present

## 2019-01-23 DIAGNOSIS — G309 Alzheimer's disease, unspecified: Secondary | ICD-10-CM | POA: Insufficient documentation

## 2019-01-23 DIAGNOSIS — Z5321 Procedure and treatment not carried out due to patient leaving prior to being seen by health care provider: Secondary | ICD-10-CM | POA: Diagnosis not present

## 2019-01-23 DIAGNOSIS — G501 Atypical facial pain: Secondary | ICD-10-CM | POA: Insufficient documentation

## 2019-01-23 DIAGNOSIS — I10 Essential (primary) hypertension: Secondary | ICD-10-CM | POA: Diagnosis not present

## 2019-01-23 DIAGNOSIS — Z79899 Other long term (current) drug therapy: Secondary | ICD-10-CM | POA: Diagnosis not present

## 2019-01-23 DIAGNOSIS — H40211 Acute angle-closure glaucoma, right eye: Secondary | ICD-10-CM | POA: Diagnosis not present

## 2019-01-23 NOTE — ED Triage Notes (Signed)
Pt has hx of dementia; per daughter, pt has been c/o R sided facial pain (nose, under eye, and around side of face) since 1930. Has tried heat/ice and ibuprofen x2 without improvement

## 2019-01-24 ENCOUNTER — Emergency Department (HOSPITAL_COMMUNITY): Payer: Medicare Other

## 2019-01-24 ENCOUNTER — Emergency Department (HOSPITAL_COMMUNITY)
Admission: EM | Admit: 2019-01-24 | Discharge: 2019-01-24 | Disposition: A | Discharge status: Home / Self Care | Payer: Medicare Other | Source: Home / Self Care | Attending: Emergency Medicine | Admitting: Emergency Medicine

## 2019-01-24 ENCOUNTER — Other Ambulatory Visit: Payer: Self-pay

## 2019-01-24 DIAGNOSIS — H40211 Acute angle-closure glaucoma, right eye: Secondary | ICD-10-CM

## 2019-01-24 DIAGNOSIS — Z79899 Other long term (current) drug therapy: Secondary | ICD-10-CM | POA: Insufficient documentation

## 2019-01-24 DIAGNOSIS — Z85828 Personal history of other malignant neoplasm of skin: Secondary | ICD-10-CM | POA: Insufficient documentation

## 2019-01-24 DIAGNOSIS — F0280 Dementia in other diseases classified elsewhere without behavioral disturbance: Secondary | ICD-10-CM | POA: Insufficient documentation

## 2019-01-24 DIAGNOSIS — I10 Essential (primary) hypertension: Secondary | ICD-10-CM | POA: Insufficient documentation

## 2019-01-24 DIAGNOSIS — G501 Atypical facial pain: Secondary | ICD-10-CM | POA: Diagnosis not present

## 2019-01-24 DIAGNOSIS — G309 Alzheimer's disease, unspecified: Secondary | ICD-10-CM | POA: Insufficient documentation

## 2019-01-24 LAB — COMPREHENSIVE METABOLIC PANEL
ALT: 11 U/L (ref 0–44)
AST: 20 U/L (ref 15–41)
Albumin: 3.9 g/dL (ref 3.5–5.0)
Alkaline Phosphatase: 74 U/L (ref 38–126)
Anion gap: 11 (ref 5–15)
BUN: 28 mg/dL — ABNORMAL HIGH (ref 8–23)
CO2: 23 mmol/L (ref 22–32)
Calcium: 9.2 mg/dL (ref 8.9–10.3)
Chloride: 98 mmol/L (ref 98–111)
Creatinine, Ser: 0.98 mg/dL (ref 0.44–1.00)
GFR calc Af Amer: 59 mL/min — ABNORMAL LOW (ref >60)
GFR calc non Af Amer: 51 mL/min — ABNORMAL LOW (ref >60)
Glucose, Bld: 103 mg/dL — ABNORMAL HIGH (ref 70–99)
Potassium: 3.7 mmol/L (ref 3.5–5.1)
Sodium: 132 mmol/L — ABNORMAL LOW (ref 135–145)
Total Bilirubin: 0.9 mg/dL (ref 0.3–1.2)
Total Protein: 6.1 g/dL — ABNORMAL LOW (ref 6.5–8.1)

## 2019-01-24 LAB — C-REACTIVE PROTEIN: CRP: <0.8 mg/dL (ref <1.0)

## 2019-01-24 LAB — CBC
HCT: 38.3 % (ref 36.0–46.0)
Hemoglobin: 12.9 g/dL (ref 12.0–15.0)
MCH: 33.2 pg (ref 26.0–34.0)
MCHC: 33.7 g/dL (ref 30.0–36.0)
MCV: 98.5 fL (ref 80.0–100.0)
Platelets: 226 10*3/uL (ref 150–400)
RBC: 3.89 MIL/uL (ref 3.87–5.11)
RDW: 12.4 % (ref 11.5–15.5)
WBC: 9.8 10*3/uL (ref 4.0–10.5)
nRBC: 0 % (ref 0.0–0.2)

## 2019-01-24 LAB — SEDIMENTATION RATE: Sed Rate: 5 mm/hr (ref 0–22)

## 2019-01-24 MED ORDER — TETRACAINE HCL 0.5 % OP SOLN
2.0000 [drp] | Freq: Once | OPHTHALMIC | Status: AC
  Administered 2019-01-24: 2 [drp] via OPHTHALMIC
  Filled 2019-01-24: qty 4

## 2019-01-24 MED ORDER — PILOCARPINE HCL 2 % OP SOLN
1.0000 [drp] | Freq: Once | OPHTHALMIC | Status: AC
  Administered 2019-01-24: 1 [drp] via OPHTHALMIC
  Filled 2019-01-24 (×2): qty 15

## 2019-01-24 MED ORDER — KETOROLAC TROMETHAMINE 15 MG/ML IJ SOLN
15.0000 mg | Freq: Once | INTRAMUSCULAR | Status: AC
  Administered 2019-01-24: 15 mg via INTRAVENOUS
  Filled 2019-01-24: qty 1

## 2019-01-24 MED ORDER — SODIUM CHLORIDE 0.9 % IV BOLUS
250.0000 mL | Freq: Once | INTRAVENOUS | Status: AC
  Administered 2019-01-24: 500 mL via INTRAVENOUS

## 2019-01-24 MED ORDER — BRIMONIDINE TARTRATE 0.15 % OP SOLN
1.0000 [drp] | Freq: Once | OPHTHALMIC | Status: AC
  Administered 2019-01-24: 1 [drp] via OPHTHALMIC
  Filled 2019-01-24: qty 5

## 2019-01-24 MED ORDER — PROCHLORPERAZINE EDISYLATE 10 MG/2ML IJ SOLN
10.0000 mg | Freq: Once | INTRAMUSCULAR | Status: AC
  Administered 2019-01-24: 10 mg via INTRAVENOUS
  Filled 2019-01-24: qty 2

## 2019-01-24 MED ORDER — APRACLONIDINE HCL 1 % OP SOLN
1.0000 [drp] | Freq: Once | OPHTHALMIC | Status: AC
  Administered 2019-01-24: 1 [drp] via OPHTHALMIC
  Filled 2019-01-24: qty 2.4

## 2019-01-24 MED ORDER — ACETAZOLAMIDE 250 MG PO TABS
500.0000 mg | ORAL_TABLET | Freq: Once | ORAL | Status: AC
  Administered 2019-01-24: 500 mg via ORAL
  Filled 2019-01-24 (×2): qty 2

## 2019-01-24 MED ORDER — MORPHINE SULFATE (PF) 4 MG/ML IV SOLN
4.0000 mg | Freq: Once | INTRAVENOUS | Status: AC
  Administered 2019-01-24: 4 mg via INTRAVENOUS
  Filled 2019-01-24: qty 1

## 2019-01-24 MED ORDER — TIMOLOL MALEATE 0.5 % OP SOLN
1.0000 [drp] | Freq: Once | OPHTHALMIC | Status: AC
  Administered 2019-01-24: 1 [drp] via OPHTHALMIC
  Filled 2019-01-24: qty 5

## 2019-01-24 MED ORDER — ONDANSETRON 8 MG PO TBDP: 8.0000 mg | ORAL_TABLET | Freq: Three times a day (TID) | ORAL | 0 refills | Status: DC | PRN

## 2019-01-24 MED ORDER — FLUORESCEIN SODIUM 1 MG OP STRP
1.0000 | ORAL_STRIP | Freq: Once | OPHTHALMIC | Status: AC
  Administered 2019-01-24: 1 via OPHTHALMIC
  Filled 2019-01-24: qty 1

## 2019-01-24 MED ORDER — ONDANSETRON HCL 4 MG/2ML IJ SOLN
4.0000 mg | Freq: Once | INTRAMUSCULAR | Status: AC
  Administered 2019-01-24: 4 mg via INTRAVENOUS
  Filled 2019-01-24: qty 2

## 2019-01-24 NOTE — ED Notes (Signed)
2nd dose of Timolol, Pilocarpine, Brimonidine and Iopidine 1 min apart administered on right eye.

## 2019-01-24 NOTE — ED Provider Notes (Signed)
[facility] EMERGENCY DEPARTMENT Provider Note   CSN: [id number] Arrival date & time: 01/24/19  1535    History   Chief Complaint Chief Complaint  Patient presents with  . Migraine  . Nausea    HPI Sophia Hancock is a 85 y.o. female.    Level 5 caveat: Dementia  HPI A 85 year old female presents the emergency department complaints of severe right-sided headache which began last night with associated nausea and right-sided facial pain.  It began last night approximately 7:30 PM.  They have tried ibuprofen without improvement in symptoms.  Patient has a history of dementia and therefore the majority the history is obtained from the daughter.  Patient had no complaints to her daughter of eye discomfort or decreased vision.  Came to the emergency department last night but was not seen secondary to wait and thus went home.  Pain is continued today and thus presents back to the emergency department.  Daughter also noted that the patient's right eye is red without known injury or trauma to the right eye.  Patient is unable to provide any reasonable history at this time given dementia and severity of her pain   Past Medical History:  Diagnosis Date  . Arthritis    per Dermatologist records  . Basal cell carcinoma    per Dermatologist records  . Cancer ([place])    skin , place on left arm and nose  . Cerumen impaction 12/03/2011  . Chicken pox as a child  . Hearing loss 12/03/2011  . Hemorrhoids   . Hyperglycemia 02/06/2013  . Hypertension   . Internal hemorrhoids with bleeding and pain 04/13/2013  . Joint pain   . Measles as a child  . Mumps as a child  . Osteoarthritis of right hip 04/26/2013  . Overweight(278.02) 12/03/2011  . Preventative health care 12/03/2011  . Pulse fast   . SVT (supraventricular tachycardia) (HCC)    h/o  . Thyroid disorder     Patient Active Problem List   Diagnosis Date Noted  . Major neurocognitive disorder due to Alzheimer's disease,  probable, without behavioral disturbance ([place]) 12/11/2017  . Hypertension, essential, benign 10/26/2017  . Memory deficit 10/26/2017  . Urge incontinence of urine 10/26/2017  . Presbycusis of both ears 10/06/2017  . Thyroid nodule 09/26/2017  . Urinary incontinence 09/26/2017  . Short-term memory loss 09/26/2017  . Bilateral impacted cerumen 10/02/2016  . Osteoporosis 12/15/2013  . Left otitis media 10/08/2013  . Osteoarthritis of right hip 04/26/2013  . Internal hemorrhoids with bleeding and pain 04/13/2013  . Hyperglycemia 02/06/2013  . Overweight(278.02) 12/03/2011  . Hearing loss 12/03/2011  . Cerumen impaction 12/03/2011  . Preventative health care 12/03/2011  . Hypertension   . Cancer ([place])   . Hemorrhoids   . SVT (supraventricular tachycardia) ([place])   . Foot pain 03/13/2011    Past Surgical History:  Procedure Laterality Date  . BREAST SURGERY     benign X 2, b/l  . CATARACT EXTRACTION, BILATERAL     one eye was done twice  . DILATION AND CURETTAGE OF UTERUS    . TONSILLECTOMY       OB History   No obstetric history on file.      Home Medications    Prior to Admission medications   Medication Sig Start Date End Date Taking? Authorizing Provider  hydrochlorothiazide (MICROZIDE) 12.5 MG capsule TAKE ONE CAPSULE BY MOUTH EVERY MORNING 09/05/14   Mosie Lukes, MD  ibuprofen (ADVIL,MOTRIN) 200  MG tablet Take 200 mg by mouth every 6 (six) hours as needed.    [provider]  lisinopril (PRINIVIL,ZESTRIL) 10 MG tablet TAKE 1/2 TABLET BY MOUTH TWICE A DAY 05/05/14   Mosie Lukes, MD  metoprolol succinate (TOPROL-XL) 25 MG 24 hr tablet Take 12.5 mg by mouth 2 (two) times daily.    [provider]    Family History Family History  Problem Relation Age of Onset  . Hypertension Mother   . Anemia Mother   . Kidney disease Mother   . Arthritis Father        severe  . Aneurysm Sister        aortic  . Breast cancer Sister   . Heart disease  Sister        aortic aneurysm  . Kidney disease Sister        dialysis  . COPD Sister   . Aneurysm Brother        adbominal  . Aortic aneurysm Brother   . Stroke Paternal Grandfather   . Cholecystitis Daughter     Social History Social History   Tobacco Use  . Smoking status: Never Smoker  . Smokeless tobacco: Never Used  Substance Use Topics  . Alcohol use: No  . Drug use: No     Allergies   Tylenol [acetaminophen]   Review of Systems Review of Systems  Unable to perform ROS: Dementia     Physical Exam Updated Vital Signs BP (!) 153/96 (BP Location: Right Arm)   Pulse (!) 59   Temp 97.9 F (36.6 C)   Resp 17   Ht 5\' 3"  (1.6 m)   Wt 61.2 kg   SpO2 98%   BMI 23.91 kg/m   Physical Exam Vitals signs and nursing note reviewed.  Constitutional:      General: She is not in acute distress.    Appearance: She is well-developed.  HENT:     Head: Normocephalic and atraumatic.  Eyes:     Comments: Conjunctival injection on the right.  Fixed pupil on the right.  Intraocular pressure on the right is 35-43.  Intraocular pressure in the left eye is 14  Neck:     Musculoskeletal: Normal range of motion.  Cardiovascular:     Rate and Rhythm: Normal rate and regular rhythm.     Heart sounds: Normal heart sounds.  Pulmonary:     Effort: Pulmonary effort is normal.     Breath sounds: Normal breath sounds.  Abdominal:     General: There is no distension.     Palpations: Abdomen is soft.     Tenderness: There is no abdominal tenderness.  Musculoskeletal: Normal range of motion.  Skin:    General: Skin is warm and dry.  Neurological:     Mental Status: She is alert.     Comments: 5/5 strength in major muscle groups of  bilateral upper and lower extremities. Speech normal. No facial asymetry.   Psychiatric:     Comments: Unable to test      ED Treatments / Results  Labs (all labs ordered are listed, but only abnormal results are displayed) Labs Reviewed   COMPREHENSIVE METABOLIC PANEL - Abnormal; Notable for the following components:      Result Value   Sodium 132 (*)    Glucose, Bld 103 (*)    BUN 28 (*)    Total Protein 6.1 (*)    GFR calc non Af Amer 51 (*)  GFR calc Af Amer 59 (*)    All other components within normal limits  CBC  SEDIMENTATION RATE  C-REACTIVE PROTEIN    EKG EKG Interpretation  Date/Time:  Sunday Jan 24 2019 15:52:16 EDT Ventricular Rate:  62 PR Interval:    QRS Duration: 140 QT Interval:  428 QTC Calculation: 435 R Axis:   -31 Text Interpretation:  Sinus rhythm Right bundle branch block Left ventricular hypertrophy No significant change was found Confirmed by Jola Schmidt [phone]) on 01/24/2019 6:16:19 PM   Radiology Ct Head Wo Contrast  Result Date: 01/24/2019 CLINICAL DATA:  RIGHT-sided headache.  Nausea EXAM: CT HEAD WITHOUT CONTRAST TECHNIQUE: Contiguous axial images were obtained from the base of the skull through the vertex without intravenous contrast. COMPARISON:  None FINDINGS: Brain: No acute intracranial hemorrhage. No focal mass lesion. No CT evidence of acute infarction. No midline shift or mass effect. No hydrocephalus. Basilar cisterns are patent. There are periventricular and subcortical white matter hypodensities. Generalized cortical atrophy. Vascular: No hyperdense vessel or unexpected calcification. Skull: Normal. Negative for fracture or focal lesion. Sinuses/Orbits: Paranasal sinuses and mastoid air cells are clear. Orbits are clear. Other: None. IMPRESSION: 1. No acute intracranial findings. 2. Chronic atrophy and white matter microvascular disease. Electronically Signed   By: Suzy Bouchard M.D.   On: 01/24/2019 17:14    Procedures .Critical Care Performed by: Jola Schmidt, MD Authorized by: Jola Schmidt, MD   Critical care provider statement:    Critical care time (minutes):  32   Critical care was time spent personally by me on the following activities:  Discussions with  consultants, evaluation of patient's response to treatment, examination of patient, ordering and performing treatments and interventions, ordering and review of laboratory studies, ordering and review of radiographic studies, pulse oximetry, re-evaluation of patient's condition, obtaining history from patient or surrogate and review of old charts   (including critical care time)  Medications Ordered in ED Medications  tetracaine (PONTOCAINE) 0.5 % ophthalmic solution 2 drop (has no administration in time range)  fluorescein ophthalmic strip 1 strip (has no administration in time range)  timolol (TIMOPTIC) 0.5 % ophthalmic solution 1 drop (has no administration in time range)  apraclonidine (IOPIDINE) 1 % ophthalmic solution 1 drop (has no administration in time range)  pilocarpine (PILOCAR) 2 % ophthalmic solution 1 drop (has no administration in time range)  morphine 4 MG/ML injection 4 mg (4 mg Intravenous Given 01/24/19 1715)  ondansetron (ZOFRAN) injection 4 mg (4 mg Intravenous Given 01/24/19 1715)  sodium chloride 0.9 % bolus 250 mL (500 mLs Intravenous New Bag/Given 01/24/19 1716)  prochlorperazine (COMPAZINE) injection 10 mg (10 mg Intravenous Given 01/24/19 1845)  ketorolac (TORADOL) 15 MG/ML injection 15 mg (15 mg Intravenous Given 01/24/19 1844)     Initial Impression / Assessment and Plan / ED Course  I have reviewed the triage vital signs and the nursing notes.  Pertinent labs & imaging results that were available during my care of the patient were reviewed by me and considered in my medical decision making (see chart for details).        CT imaging reassuring.  No intracranial abnormality.  Intraocular pressure in the right eye is elevated and therefore I suspect that the cause of her symptoms in her right eye redness is secondary to acute angle-closure glaucoma.  Patient will be started on topical meds  Case discussed with Dr. Nancy Fetter of ophthalmology who recommends Alphagan, oral  Diamox, timolol, apraclonidine, pilocarpine.  As long as  pressures are coming down into the low 20s the patient is stable for discharge from the emergency department and will follow-up with Dr. Nancy Fetter tomorrow morning in the office.   Final Clinical Impressions(s) / ED Diagnoses   Final diagnoses:  Acute angle-closure glaucoma of right eye    ED Discharge Orders         Ordered    ondansetron (ZOFRAN ODT) 8 MG disintegrating tablet  Every 8 hours PRN     01/24/19 1939           Jola Schmidt, MD 01/24/19 1941

## 2019-01-24 NOTE — Consult Note (Signed)
Pt is a 85 yo female with mild dementia admitted for right sided headache/eye ache OD. States it started yesterday. Daughter states mother keeps complaining of it and eye was beet red. Patient is very sleepy and hard of hearing during exam. minimally interactive during exam  Adnexa wnl ou  CVF unable due to poor attention Pupils - fixed mid dilated OD, round and reactive OS EOM - unable due to poor attention  [facility] - near card with glasses: 20/200 OD, 20/100 OS IOP initially mid-40s, 35 after 2 rounds of timolol, brimonidine, pilocarpine  L/L - wnl ou C/S - mild injection OD, wnl OS Cornea cloudy OD, clear OS AC - appears deep OU Lens - PCIOL ou  Vitreous clear ou   Headache/Eye Ache  - CRP wnl to r/o GCA - IOP improved after drops and patient more comfortable. Sleeping on exam.  - High suspicion for angle closure due to high IOP/mid dilated pupil/Cloudy cornea  - Advised to continue another round of IOP lowering drops when they get home. Will need gonio/eval for LPI in clinic.  - To follow up in clinic tomorrow morning for definitive treatment

## 2019-01-24 NOTE — ED Notes (Signed)
Per MD's instruction; 2nd dose of eye drops given to patient. 10 mins. After initial dose.

## 2019-01-24 NOTE — ED Notes (Addendum)
BrimonidineTartrate 0.15% given on right eye.

## 2019-01-24 NOTE — ED Triage Notes (Signed)
C/o right sided headache; unable to be relieved by Advil sinus tab @ 1130am; reported some nausea as well.

## 2019-01-24 NOTE — ED Provider Notes (Signed)
   9:08 PM BP (!) 133/109 (BP Location: Right Arm)   Pulse 69   Temp 97.9 F (36.6 C)   Resp 18   Ht 5\' 3"  (1.6 m)   Wt 61.2 kg   SpO2 98%   BMI 23.91 kg/m  Eye pressure OD 59mm/hg, OS 14 mm/hg  Patient received diamox tablet 500 mg 1922 alphagna 0.15% at [facility] 2% at 1910  Timolol 0.5% at [place] with Dr. Nancy Fetter.  Patient did not receive all of her medications.  Atenolol was not given due to miscommunication.  We will repeat the for drop dosing x3,10 minutes apart and will recheck the pressure of her right eye.    Patient with acute angle-closure glaucoma. Treated in the ED with only some improvement.  The patient was also consulted on in person by Dr. Nancy Fetter who reevaluated the patient's eye after treatment and feels that she can be temporized overnight at home and see her in the office tomorrow morning for definitive treatment.  The patient is sleeping with significant improvement in her pain.  Her daughter is at bedside who received all verbal instructions from Dr. Nancy Fetter. Patient appears appropriate for discharge at this time.  I discussed return precautions with the patient's daughter.  She has close follow-up tomorrow morning at 10 AM.   Margarita Mail, PA-C 01/25/19 1042    Sherwood Gambler, MD 01/26/19 [phone]

## 2019-01-24 NOTE — ED Notes (Signed)
Unable to locate pt in the lobby at this time

## 2019-01-24 NOTE — ED Notes (Signed)
Timolol, Pilocarpine, Brimonidine and iopidine administered on right eye 1 min apart.

## 2019-01-24 NOTE — Discharge Instructions (Addendum)
Please follow all directions given by Dr. Nancy Fetter. It is imperative that you follow up tomorrow morning as directed.  Contact a health care provider if: You have new or worsening symptoms. Get help right away if: You have severe pain in your affected eye. You have problems with your vision. You have a bad headache in the area around your eye. You develop nausea and vomiting. The same or similar symptoms develop in your other eye.

## 2019-01-24 NOTE — ED Notes (Signed)
Iopidine eye drop administered on right eye

## 2019-02-10 ENCOUNTER — Other Ambulatory Visit: Payer: Self-pay | Admitting: Otolaryngology

## 2019-02-10 DIAGNOSIS — E041 Nontoxic single thyroid nodule: Secondary | ICD-10-CM

## 2019-02-22 ENCOUNTER — Ambulatory Visit
Admission: RE | Admit: 2019-02-22 | Discharge: 2019-02-22 | Disposition: A | Discharge status: Home / Self Care | Payer: Medicare Other | Source: Ambulatory Visit | Attending: Otolaryngology | Admitting: Otolaryngology

## 2019-02-22 DIAGNOSIS — E041 Nontoxic single thyroid nodule: Secondary | ICD-10-CM

## 2019-02-24 ENCOUNTER — Encounter

## 2019-02-24 ENCOUNTER — Encounter: Payer: Self-pay | Admitting: Podiatry

## 2019-02-24 ENCOUNTER — Ambulatory Visit (INDEPENDENT_AMBULATORY_CARE_PROVIDER_SITE_OTHER): Payer: Medicare Other | Admitting: Podiatry

## 2019-02-24 ENCOUNTER — Other Ambulatory Visit: Payer: Self-pay

## 2019-02-24 VITALS — Temp 97.9°F

## 2019-02-24 DIAGNOSIS — M79676 Pain in unspecified toe(s): Secondary | ICD-10-CM | POA: Diagnosis not present

## 2019-02-24 DIAGNOSIS — B351 Tinea unguium: Secondary | ICD-10-CM

## 2019-02-24 DIAGNOSIS — L84 Corns and callosities: Secondary | ICD-10-CM | POA: Diagnosis not present

## 2019-02-24 NOTE — Patient Instructions (Signed)

## 2019-02-28 NOTE — Progress Notes (Signed)
Subjective:  Sophia Hancock presents to clinic today with cc of  painful, thick, discolored, elongated toenails 1-5 b/l and painful corn left 3rd digit  that both become tender  when wearing enclosed shoe gear.   Current Outpatient Medications:  .  hydrochlorothiazide (MICROZIDE) 12.5 MG capsule, TAKE ONE CAPSULE BY MOUTH EVERY MORNING, Disp: 30 capsule, Rfl: 0 .  ibuprofen (ADVIL,MOTRIN) 200 MG tablet, Take 200 mg by mouth every 6 (six) hours as needed., Disp: , Rfl:  .  lisinopril (PRINIVIL,ZESTRIL) 10 MG tablet, TAKE 1/2 TABLET BY MOUTH TWICE A DAY, Disp: 30 tablet, Rfl: 0 .  metoprolol succinate (TOPROL-XL) 25 MG 24 hr tablet, Take 12.5 mg by mouth 2 (two) times daily., Disp: , Rfl:  .  ondansetron (ZOFRAN ODT) 8 MG disintegrating tablet, Take 1 tablet (8 mg total) by mouth every 8 (eight) hours as needed for nausea or vomiting., Disp: 10 tablet, Rfl: 0   Allergies  Allergen Reactions  . Tylenol [Acetaminophen]     Pt reports being very sensitive to all pain medications, including OTC pain medications. Pt reports feeling swimmy headed, knocked out, or extremely nervous when she has taken pain medications.      Objective: Vitals:   02/24/19 1609  Temp: 97.9 F (36.6 C)    Physical Examination:  Vascular Examination: Capillary refill time immediate x 10 digits.  Palpable DP/PT pulses b/l.  Digital hair present b/l.  No edema noted b/l.  Skin temperature gradient WNL b/l.  Dermatological Examination: Skin with normal turgor, texture and tone b/l.  No open wounds b/l.  No interdigital macerations noted b/l.  Elongated, thick, discolored brittle toenails with subungual debris and pain on dorsal palpation of nailbeds 1-5 b/l.  Hyperkeratotic lesion distal tip left 3rd toe. No erythema, no edema, no drainage, no flocculence noted.  Musculoskeletal Examination: Muscle strength 5/5 to all muscle groups b/l.  Hammertoe left 3rd digit.  No pain, crepitus or joint  discomfort with active/passive ROM.  Neurological Examination: Sensation intact 5/5 b/l with 10 gram monofilament.  Assessment: Mycotic nail infection with pain 1-5 b/l Corn distal tip left 3rd digit   Plan: 1. Toenails 1-5 b/l were debrided in length and girth without iatrogenic laceration. 2. Corn pared distal tip left 3rd digit utilizing sterile scalpel blade without incident. 3.  Continue soft, supportive shoe gear daily. 4.  Report any pedal injuries to medical professional. 5.  Follow up 3 months. 6.  Patient/POA to call should there be a question/concern in there interim.

## 2019-04-13 DIAGNOSIS — N183 Chronic kidney disease, stage 3 unspecified: Secondary | ICD-10-CM | POA: Insufficient documentation

## 2019-05-28 ENCOUNTER — Ambulatory Visit: Payer: Medicare Other | Admitting: Podiatry

## 2019-07-09 ENCOUNTER — Encounter: Payer: Self-pay | Admitting: Podiatry

## 2019-07-09 ENCOUNTER — Ambulatory Visit (INDEPENDENT_AMBULATORY_CARE_PROVIDER_SITE_OTHER): Payer: Medicare Other | Admitting: Podiatry

## 2019-07-09 ENCOUNTER — Other Ambulatory Visit: Payer: Self-pay

## 2019-07-09 DIAGNOSIS — L84 Corns and callosities: Secondary | ICD-10-CM

## 2019-07-09 DIAGNOSIS — M79676 Pain in unspecified toe(s): Secondary | ICD-10-CM

## 2019-07-09 DIAGNOSIS — B351 Tinea unguium: Secondary | ICD-10-CM

## 2019-07-09 NOTE — Patient Instructions (Signed)

## 2019-07-15 NOTE — Progress Notes (Signed)
Subjective: Sophia Hancock is seen today for follow up painful, elongated, thickened toenails 1-5 b/l feet that she cannot cut. Pain interferes with daily activities. Aggravating factor includes wearing enclosed shoe gear and relieved with periodic debridement.  She voices no new pedal concerns on today's visit.  Current Outpatient Medications on File Prior to Visit  Medication Sig  . hydrochlorothiazide (MICROZIDE) 12.5 MG capsule TAKE ONE CAPSULE BY MOUTH EVERY MORNING  . ibuprofen (ADVIL,MOTRIN) 200 MG tablet Take 200 mg by mouth every 6 (six) hours as needed.  Marland Kitchen lisinopril (PRINIVIL,ZESTRIL) 10 MG tablet TAKE 1/2 TABLET BY MOUTH TWICE A DAY  . metoprolol succinate (TOPROL-XL) 25 MG 24 hr tablet Take 12.5 mg by mouth 2 (two) times daily.  . ondansetron (ZOFRAN ODT) 8 MG disintegrating tablet Take 1 tablet (8 mg total) by mouth every 8 (eight) hours as needed for nausea or vomiting.   No current facility-administered medications on file prior to visit.      Allergies  Allergen Reactions  . Tylenol [Acetaminophen]     Pt reports being very sensitive to all pain medications, including OTC pain medications. Pt reports feeling swimmy headed, knocked out, or extremely nervous when she has taken pain medications.    Objective:  Vascular Examination: Capillary refill time immediate x 10 digits.  Dorsalis pedis and Posterior tibial pulses present b/l.  Digital hair present b/l.  Skin temperature gradient WNL b/l.   Dermatological Examination: Skin with normal turgor, texture and tone b/l.  Toenails 1-5 b/l discolored, thick, dystrophic with subungual debris and pain with palpation to nailbeds due to thickness of nails.  Hyperkeratotic lesion distal tip left 3rd digit. No erythema, no edema, no drainage, no flocculence noted.  Musculoskeletal: Muscle strength 5/5 to all LE muscle groups b/l.  Hammertoe left 3rd digit.   No pain, crepitus or joint limitation noted with ROM.    Neurological Examination: Protective sensation intact 5/5 b/l with 10 gram monofilament.  Assessment: Painful onychomycosis toenails 1-5 b/l  Corn distal tip left 3rd digit  Plan: 1. Toenails 1-5 b/l were debrided in length and girth without iatrogenic bleeding.  2. Corn left 3rd digit pared utilizing sterile scalpel blade without incident. 3. Patient to continue soft, supportive shoe gear. 4. Patient to report any pedal injuries to medical professional immediately. 5. Follow up 3 months.  6. Patient/POA to call should there be a concern in the interim.

## 2019-10-08 ENCOUNTER — Encounter: Payer: Self-pay | Admitting: Podiatry

## 2019-10-08 ENCOUNTER — Ambulatory Visit (INDEPENDENT_AMBULATORY_CARE_PROVIDER_SITE_OTHER): Payer: Medicare Other | Admitting: Podiatry

## 2019-10-08 ENCOUNTER — Other Ambulatory Visit: Payer: Self-pay

## 2019-10-08 DIAGNOSIS — B351 Tinea unguium: Secondary | ICD-10-CM

## 2019-10-08 DIAGNOSIS — M79676 Pain in unspecified toe(s): Secondary | ICD-10-CM

## 2019-10-08 DIAGNOSIS — L84 Corns and callosities: Secondary | ICD-10-CM

## 2019-10-08 NOTE — Patient Instructions (Signed)

## 2019-10-10 ENCOUNTER — Ambulatory Visit: Payer: Medicare Other | Attending: Internal Medicine

## 2019-10-10 DIAGNOSIS — Z23 Encounter for immunization: Secondary | ICD-10-CM

## 2019-10-10 NOTE — Progress Notes (Signed)
Subjective: Sophia Hancock presents today for follow up of corn(s) left foot and painful mycotic toenails b/l that are difficult to trim. Pain interferes with ambulation. Aggravating factors include wearing enclosed shoe gear. Pain is relieved with periodic professional debridement..   Allergies  Allergen Reactions  . Tylenol [Acetaminophen]     Pt reports being very sensitive to all pain medications, including OTC pain medications. Pt reports feeling swimmy headed, knocked out, or extremely nervous when she has taken pain medications.      Objective: There were no vitals filed for this visit.  Vascular Examination:  Capillary refill time to digits immediate b/l, palpable DP pulses b/l, palpable PT pulses b/l, pedal hair present b/l and skin temperature gradient within normal limits b/l  Dermatological Examination: Pedal skin with normal turgor, texture and tone bilaterally, no open wounds bilaterally, no interdigital macerations bilaterally, toenails 1-5 b/l elongated, dystrophic, thickened, crumbly with subungual debris and hyperkeratotic lesion(s) distal tip left 3rd digit.  No erythema, no edema, no drainage, no flocculence  Musculoskeletal: Normal muscle strength 5/5 to all lower extremity muscle groups bilaterally, no pain crepitus or joint limitation noted with ROM b/l and hammertoes noted to the  L 3rd toe  Neurological: Protective sensation intact 5/5 intact bilaterally with 10g monofilament b/l  Assessment: 1. Pain due to onychomycosis of toenail   2. Corns    Plan: -Toenails 1-5 b/l were debrided in length and girth without iatrogenic bleeding. -corn(s) debrided left 3rd digit without complication or incident. Total number debrided=1 -Patient to continue soft, supportive shoe gear daily. -Patient to report any pedal injuries to medical professional immediately. -Patient/POA to call should there be question/concern in the interim.  Return in about 3 months (around  01/05/2020) for nail and callus trim.

## 2019-10-10 NOTE — Progress Notes (Signed)
   Covid-19 Vaccination Clinic  Name:  Sophia Hancock    MRN: [id number] DOB: 12-23-1927  10/10/2019  [name] was observed post Covid-19 immunization for 15 minutes without incidence. She was provided with Vaccine Information Sheet and instruction to access the V-Safe system.   [name] was instructed to call 911 with any severe reactions post vaccine: Marland Kitchen Difficulty breathing  . Swelling of your face and throat  . A fast heartbeat  . A bad rash all over your body  . Dizziness and weakness    Immunizations Administered    Name Date Dose VIS Date Route   Pfizer COVID-19 Vaccine 10/10/2019  1:30 PM 0.3 mL 08/06/2019 Intramuscular   Manufacturer: [place]   Lot: [id number]   [place]: [id number]

## 2019-11-02 ENCOUNTER — Ambulatory Visit: Payer: Medicare Other | Attending: Internal Medicine

## 2019-11-02 DIAGNOSIS — Z23 Encounter for immunization: Secondary | ICD-10-CM | POA: Insufficient documentation

## 2019-11-02 NOTE — Progress Notes (Signed)
   Covid-19 Vaccination Clinic  Name:  Sophia Hancock    MRN: [id number] DOB: 1927-10-16  11/02/2019  [name] was observed post Covid-19 immunization for 15 minutes without incident. She was provided with Vaccine Information Sheet and instruction to access the V-Safe system.   [name] was instructed to call 911 with any severe reactions post vaccine: Marland Kitchen Difficulty breathing  . Swelling of face and throat  . A fast heartbeat  . A bad rash all over body  . Dizziness and weakness   Immunizations Administered    Name Date Dose VIS Date Route   Pfizer COVID-19 Vaccine 11/02/2019 12:18 PM 0.3 mL 08/06/2019 Intramuscular   Manufacturer: [place]   Lot: [id number]   [place]: [id number]

## 2019-11-03 ENCOUNTER — Ambulatory Visit: Payer: Medicare Other

## 2020-01-05 ENCOUNTER — Encounter: Payer: Self-pay | Admitting: Podiatry

## 2020-01-05 ENCOUNTER — Ambulatory Visit (INDEPENDENT_AMBULATORY_CARE_PROVIDER_SITE_OTHER): Payer: Medicare Other | Admitting: Podiatry

## 2020-01-05 ENCOUNTER — Other Ambulatory Visit: Payer: Self-pay

## 2020-01-05 DIAGNOSIS — L84 Corns and callosities: Secondary | ICD-10-CM

## 2020-01-05 DIAGNOSIS — M79675 Pain in left toe(s): Secondary | ICD-10-CM

## 2020-01-05 DIAGNOSIS — M79676 Pain in unspecified toe(s): Secondary | ICD-10-CM | POA: Diagnosis not present

## 2020-01-05 DIAGNOSIS — B351 Tinea unguium: Secondary | ICD-10-CM

## 2020-01-05 NOTE — Patient Instructions (Signed)

## 2020-01-07 ENCOUNTER — Ambulatory Visit: Payer: Medicare Other | Admitting: Podiatry

## 2020-01-13 NOTE — Progress Notes (Signed)
Subjective: Sophia Hancock is a 86 y.o. female patient seen today corn(s) left 3rd toe and painful mycotic toenails b/l that are difficult to trim. Pain interferes with ambulation. Aggravating factors include wearing enclosed shoe gear. Pain is relieved with periodic professional debridement.  Patient Active Problem List   Diagnosis Date Noted  . Major neurocognitive disorder due to Alzheimer's disease, probable, without behavioral disturbance (Sophia Hancock) 12/11/2017  . Hypertension, essential, benign 10/26/2017  . Memory deficit 10/26/2017  . Urge incontinence of urine 10/26/2017  . Presbycusis of both ears 10/06/2017  . Thyroid nodule 09/26/2017  . Urinary incontinence 09/26/2017  . Short-term memory loss 09/26/2017  . Bilateral impacted cerumen 10/02/2016  . Osteoporosis 12/15/2013  . Left otitis media 10/08/2013  . Osteoarthritis of right hip 04/26/2013  . Internal hemorrhoids with bleeding and pain 04/13/2013  . Hyperglycemia 02/06/2013  . Overweight(278.02) 12/03/2011  . Hearing loss 12/03/2011  . Cerumen impaction 12/03/2011  . Preventative health care 12/03/2011  . Hypertension   . Cancer (Sophia Hancock)   . Hemorrhoids   . SVT (supraventricular tachycardia) (Sophia Hancock)   . Foot pain 03/13/2011    Current Outpatient Medications on File Prior to Visit  Medication Sig Dispense Refill  . hydrochlorothiazide (MICROZIDE) 12.5 MG capsule TAKE ONE CAPSULE BY MOUTH EVERY MORNING 30 capsule 0  . ibuprofen (ADVIL,MOTRIN) 200 MG tablet Take 200 mg by mouth every 6 (six) hours as needed.    Marland Kitchen lisinopril (PRINIVIL,ZESTRIL) 10 MG tablet TAKE 1/2 TABLET BY MOUTH TWICE A DAY 30 tablet 0  . metoprolol succinate (TOPROL-XL) 25 MG 24 hr tablet Take 12.5 mg by mouth 2 (two) times daily.    . ondansetron (ZOFRAN ODT) 8 MG disintegrating tablet Take 1 tablet (8 mg total) by mouth every 8 (eight) hours as needed for nausea or vomiting. 10 tablet 0   No current facility-administered medications on file prior to visit.     Allergies  Allergen Reactions  . Tylenol [Acetaminophen]     Pt reports being very sensitive to all pain medications, including OTC pain medications. Pt reports feeling swimmy headed, knocked out, or extremely nervous when she has taken pain medications.     Objective: Physical Exam  General: [name] is a pleasant 86 y.o. Caucasian female, in NAD. AAO x 3.   Vascular:  Capillary refill time to digits immediate b/l. Palpable DP pulses b/l. Palpable PT pulses b/l. Pedal hair absent b/l Skin temperature gradient within normal limits b/l.  Dermatological:  Pedal skin is thin shiny, atrophic bilaterally. No open wounds bilaterally. No interdigital macerations bilaterally. Toenails 1-5 b/l elongated, dystrophic, thickened, crumbly with subungual debris and tenderness to dorsal palpation. Hyperkeratotic lesion(s) L 3rd toe.  No erythema, no edema, no drainage, no flocculence.  Musculoskeletal:  Normal muscle strength 5/5 to all lower extremity muscle groups bilaterally. No pain crepitus or joint limitation noted with ROM b/l. Hammertoes noted to the L 3rd toe.  Neurological:  Protective sensation intact 5/5 intact bilaterally with 10g monofilament b/l.  Assessment and Plan:  1. Pain due to onychomycosis of toenail   2. Corns   3. Pain of toe of left foot    -Examined patient. -Medicare ABN signed for this year. Copy given to patient on today's visit and copy placed in patient's chart. -Toenails 1-5 b/l were debrided in length and girth with sterile nail nippers and dremel without iatrogenic bleeding.  -Corn(s) L 3rd toe pared utilizing sterile scalpel blade without complication or incident. Total number debrided=1. -Patient to  continue soft, supportive shoe gear daily. -Patient to report any pedal injuries to medical professional immediately. -Patient/POA to call should there be question/concern in the interim.  Return in about 3 months (around 04/06/2020) for nail and callus  trim.  Marzetta Board, DPM

## 2020-03-06 ENCOUNTER — Telehealth: Payer: Self-pay | Admitting: Podiatry

## 2020-03-06 NOTE — Telephone Encounter (Signed)
I'm calling on behalf of my mother Sophia Hancock, guarantor number [id number]. My number is [phone]. Received a [employer] claim detail and it looks like, not sure how to read this exactly, but it looks like her last visit (05-12) was billed twice. Its the same date, and looks like same service and same pricing but two different claim numbers and they have a notation that we have duplicate service and plan does not cover Medicare Part B. Received a letter or bill in the mail today from your offices and it looks like only 1 was billed here probably. I just wanted to make sure we are good. I think we came in earlier than normal and knew we would be responsible for this. I just wanted to make sure it wasn't billed twice to the insurance company. Thank you.

## 2020-04-05 ENCOUNTER — Other Ambulatory Visit: Payer: Self-pay

## 2020-04-05 ENCOUNTER — Ambulatory Visit (INDEPENDENT_AMBULATORY_CARE_PROVIDER_SITE_OTHER): Payer: Medicare Other | Admitting: Podiatry

## 2020-04-05 DIAGNOSIS — B351 Tinea unguium: Secondary | ICD-10-CM

## 2020-04-05 DIAGNOSIS — M79676 Pain in unspecified toe(s): Secondary | ICD-10-CM

## 2020-04-05 DIAGNOSIS — Q828 Other specified congenital malformations of skin: Secondary | ICD-10-CM

## 2020-04-05 DIAGNOSIS — M79675 Pain in left toe(s): Secondary | ICD-10-CM

## 2020-04-06 ENCOUNTER — Encounter: Payer: Self-pay | Admitting: Podiatry

## 2020-04-06 NOTE — Progress Notes (Signed)
Subjective: Sophia Hancock is a 86 y.o. female patient seen today corn(s) left 3rd toe and painful mycotic toenails b/l that are difficult to trim. Pain interferes with ambulation. Aggravating factors include wearing enclosed shoe gear. Pain is relieved with periodic professional debridement.   Her daughter is present during today's visit. States corn on left 3rd toe has really grown this time.   Patient Active Problem List   Diagnosis Date Noted  . Major neurocognitive disorder due to Alzheimer's disease, probable, without behavioral disturbance (Sophia Hancock) 12/11/2017  . Hypertension, essential, benign 10/26/2017  . Memory deficit 10/26/2017  . Urge incontinence of urine 10/26/2017  . Presbycusis of both ears 10/06/2017  . Thyroid nodule 09/26/2017  . Urinary incontinence 09/26/2017  . Short-term memory loss 09/26/2017  . Bilateral impacted cerumen 10/02/2016  . Osteoporosis 12/15/2013  . Left otitis media 10/08/2013  . Osteoarthritis of right hip 04/26/2013  . Internal hemorrhoids with bleeding and pain 04/13/2013  . Hyperglycemia 02/06/2013  . Overweight(278.02) 12/03/2011  . Hearing loss 12/03/2011  . Cerumen impaction 12/03/2011  . Preventative health care 12/03/2011  . Hypertension   . Cancer ([place])   . Hemorrhoids   . SVT (supraventricular tachycardia) ([place])   . Foot pain 03/13/2011    Current Outpatient Medications on File Prior to Visit  Medication Sig Dispense Refill  . hydrochlorothiazide (MICROZIDE) 12.5 MG capsule TAKE ONE CAPSULE BY MOUTH EVERY MORNING (Patient not taking: Reported on 04/05/2020) 30 capsule 0  . ibuprofen (ADVIL,MOTRIN) 200 MG tablet Take 200 mg by mouth every 6 (six) hours as needed.    Sophia Hancock Kitchen lisinopril (PRINIVIL,ZESTRIL) 10 MG tablet TAKE 1/2 TABLET BY MOUTH TWICE A DAY (Patient not taking: Reported on 04/05/2020) 30 tablet 0  . metoprolol succinate (TOPROL-XL) 25 MG 24 hr tablet Take 12.5 mg by mouth 2 (two) times daily. (Patient not taking: Reported on  04/05/2020)    . ondansetron (ZOFRAN ODT) 8 MG disintegrating tablet Take 1 tablet (8 mg total) by mouth every 8 (eight) hours as needed for nausea or vomiting. 10 tablet 0   No current facility-administered medications on file prior to visit.    Allergies  Allergen Reactions  . Tylenol [Acetaminophen]     Pt reports being very sensitive to all pain medications, including OTC pain medications. Pt reports feeling swimmy headed, knocked out, or extremely nervous when she has taken pain medications.     Objective: Physical Exam  General: Sophia Hancock is a pleasant 86 y.o. Caucasian female, in NAD. AAO x 3.   Vascular:  Capillary refill time to digits immediate b/l. Palpable DP pulses b/l. Palpable PT pulses b/l. Pedal hair absent b/l Skin temperature gradient within normal limits b/l.  Dermatological:  Pedal skin is thin shiny, atrophic b/l lower extremities. No open wounds bilaterally. No interdigital macerations bilaterally. Toenails 1-5 b/l elongated, discolored, dystrophic, thickened, crumbly with subungual debris and tenderness to dorsal palpation. Porokeratotic lesion(s) L 3rd toe. No erythema, no edema, no drainage, no flocculence.  Musculoskeletal:  Normal muscle strength 5/5 to all lower extremity muscle groups bilaterally. No pain crepitus or joint limitation noted with ROM b/l. Hammertoes noted to the L 3rd toe.  Neurological:  Protective sensation intact 5/5 intact bilaterally with 10g monofilament b/l.  Assessment and Plan:  1. Pain due to onychomycosis of toenail   2. Porokeratosis   3. Pain of toe of left foot    -Examined patient. -Medicare ABN signed for this year. Copy given to patient on today's visit and copy  placed in patient's chart.  -Painful porokeratotic lesion(s) L 3rd toe pared and enucleated with sterile scalpel blade without incident. Patient related relief after treatment on today's visit. -Patient to report any pedal injuries to medical professional  immediately. -Dispensed digital toe cap. Apply every morning. Remove every evening. -Patient to continue soft, supportive shoe gear daily. -Patient/POA to call should there be question/concern in the interim. -We will decrease time between visits due to painful lesion to avoid ulceration.  Return in about 9 weeks (around 06/07/2020) for nail and callus trim.  Marzetta Board, DPM

## 2020-05-29 ENCOUNTER — Ambulatory Visit (INDEPENDENT_AMBULATORY_CARE_PROVIDER_SITE_OTHER): Payer: Medicare Other | Admitting: Podiatry

## 2020-05-29 ENCOUNTER — Other Ambulatory Visit: Payer: Self-pay

## 2020-05-29 DIAGNOSIS — M79675 Pain in left toe(s): Secondary | ICD-10-CM

## 2020-05-29 DIAGNOSIS — Q828 Other specified congenital malformations of skin: Secondary | ICD-10-CM

## 2020-05-29 DIAGNOSIS — M2042 Other hammer toe(s) (acquired), left foot: Secondary | ICD-10-CM

## 2020-05-29 DIAGNOSIS — L989 Disorder of the skin and subcutaneous tissue, unspecified: Secondary | ICD-10-CM

## 2020-05-30 ENCOUNTER — Encounter: Payer: Self-pay | Admitting: Podiatry

## 2020-05-31 NOTE — Telephone Encounter (Signed)
Patient's daughter called with additional questions:  1. Can she walk on it? She wants to go to the grocery store 2. What is the normal post appearance while recovering 3. Any after-care instructions she needs to know.

## 2020-06-02 NOTE — Progress Notes (Signed)
   HPI: 86 y.o. female presenting today for evaluation of recurrent painful callus that is been developing to the patient's left third digit.  It is very painful and despite conservative routine foot care the callus continues to recur and become very painful.  She is unable to walk without pain.  She presents today for further treatment and evaluation.  Past Medical History:  Diagnosis Date  . Arthritis    per Dermatologist records  . Basal cell carcinoma    per Dermatologist records  . Cancer ([place])    skin , place on left arm and nose  . Cerumen impaction 12/03/2011  . Chicken pox as a child  . Hearing loss 12/03/2011  . Hemorrhoids   . Hyperglycemia 02/06/2013  . Hypertension   . Internal hemorrhoids with bleeding and pain 04/13/2013  . Joint pain   . Measles as a child  . Mumps as a child  . Osteoarthritis of right hip 04/26/2013  . Overweight(278.02) 12/03/2011  . Preventative health care 12/03/2011  . Pulse fast   . SVT (supraventricular tachycardia) ([facility])    h/o  . Thyroid disorder      Objective: Physical Exam General: The patient is alert and oriented x3 in no acute distress.  Dermatology: Skin is cool, dry and supple bilateral lower extremities. Negative for open lesions or macerations.  Hyperkeratotic preulcerative callus tissue noted to the distal tip of the left third toe  Vascular: Palpable pedal pulses bilaterally. No edema or erythema noted. Capillary refill within normal limits.  Neurological: Epicritic and protective threshold grossly intact bilaterally.   Musculoskeletal Exam: All pedal and ankle joints range of motion within normal limits bilateral. Muscle strength 5/5 in all groups bilateral.  semireducible hammertoe contracture deformity noted to the symptomatic toe  Assessment: 1.  Reducible hammertoe left third digit 2.  Recurring painful preulcerative callus distal tip of the left third digit   Plan of Care:  1. Patient evaluated.  2.  Different treatment  options were discussed with the patient. 3.  After evaluating the patient I do believe that a flexor tenotomy to the respective digit would help alleviate the patient's symptoms.  This would lift the toe to alleviate pressure from the digit.  The procedure was explained in detail and all patient questions were answered.  No guarantees were expressed or implied.  The patient consented for correction here in the office 4.  Prior to procedure the toe was blocked in a digital block fashion using 3 mL of lidocaine 2%. 5.  Flexor tenotomy was performed of the respective digit using a surgical #11 scalpel and a small percutaneous stab incision on the plantar sulcus of the toe.  The toe was immediately in a more rectus position.  Betadine soaked dry sterile dressing was applied. 6.  Post care instructions were provided 7.  Surgical shoe dispensed 8.  Return to clinic in 1 week    Edrick Kins, DPM [facility]  Dr. Edrick Kins, DPM    [address],  [address]                Office [phone]  Fax [phone]

## 2020-06-05 ENCOUNTER — Ambulatory Visit (INDEPENDENT_AMBULATORY_CARE_PROVIDER_SITE_OTHER): Payer: Medicare Other | Admitting: Podiatry

## 2020-06-05 ENCOUNTER — Other Ambulatory Visit: Payer: Self-pay

## 2020-06-05 DIAGNOSIS — M2042 Other hammer toe(s) (acquired), left foot: Secondary | ICD-10-CM

## 2020-06-05 DIAGNOSIS — M79675 Pain in left toe(s): Secondary | ICD-10-CM

## 2020-06-05 NOTE — Progress Notes (Signed)
   Subjective:  Patient presents today status post flexor tenotomy of the left third toe.  Date of procedure: 05/29/2020.  Patient and the daughter states that she is doing very well.  She does have some trouble keeping the dressings intact.  Otherwise she has been weightbearing in the postsurgical shoe with no problem and doing well.  No new complaints at this time  Past Medical History:  Diagnosis Date  . Arthritis    per Dermatologist records  . Basal cell carcinoma    per Dermatologist records  . Cancer ([place])    skin , place on left arm and nose  . Cerumen impaction 12/03/2011  . Chicken pox as a child  . Hearing loss 12/03/2011  . Hemorrhoids   . Hyperglycemia 02/06/2013  . Hypertension   . Internal hemorrhoids with bleeding and pain 04/13/2013  . Joint pain   . Measles as a child  . Mumps as a child  . Osteoarthritis of right hip 04/26/2013  . Overweight(278.02) 12/03/2011  . Preventative health care 12/03/2011  . Pulse fast   . SVT (supraventricular tachycardia) ([facility])    h/o  . Thyroid disorder       Objective/Physical Exam Neurovascular status intact.  The small stab incision to the plantar aspect of the toe is healed completely.  The toe is in a more rectus alignment with alleviation of the hammertoe deformity.  There appears to be less pressure to the tip of the toe.  There is some mild tenderness to the toe today.  Assessment: 1. s/p flexor tenotomy left third toe doing well. DOS: 05/29/2020   Plan of Care:  1. Patient was evaluated. 2.  Discontinue postsurgical shoe.  Recommend good supportive sneakers 3.  Return to clinic on neck scheduled appointment for routine foot care   Edrick Kins, DPM [facility]  Dr. Edrick Kins, [address]                                        [place],  [address]                Office [phone]  Fax [phone]

## 2020-07-05 ENCOUNTER — Ambulatory Visit: Payer: Medicare Other | Admitting: Podiatry

## 2020-07-12 ENCOUNTER — Ambulatory Visit (INDEPENDENT_AMBULATORY_CARE_PROVIDER_SITE_OTHER): Payer: Medicare Other | Admitting: Podiatry

## 2020-07-12 ENCOUNTER — Other Ambulatory Visit: Payer: Self-pay

## 2020-07-12 DIAGNOSIS — M2042 Other hammer toe(s) (acquired), left foot: Secondary | ICD-10-CM

## 2020-07-12 DIAGNOSIS — Q828 Other specified congenital malformations of skin: Secondary | ICD-10-CM

## 2020-07-12 DIAGNOSIS — L989 Disorder of the skin and subcutaneous tissue, unspecified: Secondary | ICD-10-CM

## 2020-07-12 NOTE — Progress Notes (Signed)
   HPI: 86 y.o. female presenting today for follow-up evaluation of her recurrent symptomatic callus to the distal tip of the left third toe.  Patient states that despite the flexor tenotomy that was performed she continues to have recurrent painful callus development.  They present for further treatment and evaluation  Past Medical History:  Diagnosis Date  . Arthritis    per Dermatologist records  . Basal cell carcinoma    per Dermatologist records  . Cancer ([place])    skin , place on left arm and nose  . Cerumen impaction 12/03/2011  . Chicken pox as a child  . Hearing loss 12/03/2011  . Hemorrhoids   . Hyperglycemia 02/06/2013  . Hypertension   . Internal hemorrhoids with bleeding and pain 04/13/2013  . Joint pain   . Measles as a child  . Mumps as a child  . Osteoarthritis of right hip 04/26/2013  . Overweight(278.02) 12/03/2011  . Preventative health care 12/03/2011  . Pulse fast   . SVT (supraventricular tachycardia) ([facility])    h/o  . Thyroid disorder      Objective: Physical Exam General: The patient is alert and oriented x3 in no acute distress.  Dermatology: Skin is cool, dry and supple bilateral lower extremities. Negative for open lesions or macerations.  Hyperkeratotic preulcerative callus tissue noted to the distal tip of the left third toe  Vascular: Palpable pedal pulses bilaterally. No edema or erythema noted. Capillary refill within normal limits.  Neurological: Epicritic and protective threshold grossly intact bilaterally.   Musculoskeletal Exam: All pedal and ankle joints range of motion within normal limits bilateral. Muscle strength 5/5 in all groups bilateral.    Assessment: 1.  History of flexor tenotomy left third toe 2.  Recurring painful preulcerative callus distal tip of the left third digit   Plan of Care:  1. Patient evaluated.  Unfortunately the patient has a recurrence of the painful callus at the distal tip of the left third toe despite performing the  flexor tenotomy procedure.  We will simply resume conservative routine care of the callus 2.  Excisional debridement of the hyperkeratotic preulcerative callus tissue was performed using a chisel blade and tissue nipper without incident or bleeding.  Light dressing was applied. 3.  Silicone toe caps were dispensed today.  Wear daily. 4.  Return to clinic as needed    Edrick Kins, DPM [facility]  Dr. Edrick Kins, DPM    [address],  [address]                Office [phone]  Fax [phone]

## 2020-09-11 ENCOUNTER — Ambulatory Visit: Payer: Medicare Other | Admitting: Podiatry

## 2020-09-19 ENCOUNTER — Encounter: Payer: Self-pay | Admitting: Podiatry

## 2020-09-19 ENCOUNTER — Ambulatory Visit (INDEPENDENT_AMBULATORY_CARE_PROVIDER_SITE_OTHER): Payer: Medicare Other | Admitting: Podiatry

## 2020-09-19 ENCOUNTER — Other Ambulatory Visit: Payer: Self-pay

## 2020-09-19 DIAGNOSIS — Q828 Other specified congenital malformations of skin: Secondary | ICD-10-CM

## 2020-09-19 DIAGNOSIS — B351 Tinea unguium: Secondary | ICD-10-CM

## 2020-09-19 DIAGNOSIS — M79675 Pain in left toe(s): Secondary | ICD-10-CM

## 2020-09-19 DIAGNOSIS — M79676 Pain in unspecified toe(s): Secondary | ICD-10-CM | POA: Diagnosis not present

## 2020-09-23 NOTE — Progress Notes (Signed)
Subjective: Sophia Hancock is a 87 y.o. female patient seen today corn(s) left 3rd toe and painful mycotic toenails b/l that are difficult to trim. Pain interferes with ambulation. Aggravating factors include wearing enclosed shoe gear. Pain is relieved with periodic professional debridement.   Her daughter is present during today's visit. They relate despite flexor tenotomy of left 3rd digit, she continues to develop the painful lesion on the tip of the digit. They voice no other pedal concerns on today's visit.  Patient Active Problem List   Diagnosis Date Noted  . Chronic kidney disease (CKD), stage III (moderate) ([place]) 04/13/2019  . Major neurocognitive disorder due to Alzheimer's disease, probable, without behavioral disturbance 12/11/2017  . Hypertension, essential, benign 10/26/2017  . Memory deficit 10/26/2017  . Urge incontinence of urine 10/26/2017  . Presbycusis of both ears 10/06/2017  . Thyroid nodule 09/26/2017  . Urinary incontinence 09/26/2017  . Short-term memory loss 09/26/2017  . Bilateral impacted cerumen 10/02/2016  . Osteoporosis 12/15/2013  . Left otitis media 10/08/2013  . Osteoarthritis of right hip 04/26/2013  . Internal hemorrhoids with bleeding and pain 04/13/2013  . Hyperglycemia 02/06/2013  . Overweight(278.02) 12/03/2011  . Hearing loss 12/03/2011  . Cerumen impaction 12/03/2011  . Preventative health care 12/03/2011  . Hypertension   . Cancer ([place])   . Hemorrhoids   . SVT (supraventricular tachycardia) ([place])   . Foot pain 03/13/2011    Current Outpatient Medications on File Prior to Visit  Medication Sig Dispense Refill  . hydrochlorothiazide (MICROZIDE) 12.5 MG capsule TAKE ONE CAPSULE BY MOUTH EVERY MORNING (Patient not taking: Reported on 04/05/2020) 30 capsule 0  . ibuprofen (ADVIL,MOTRIN) 200 MG tablet Take 200 mg by mouth every 6 (six) hours as needed.     No current facility-administered medications on file prior to visit.    Allergies   Allergen Reactions  . Tylenol [Acetaminophen]     Pt reports being very sensitive to all pain medications, including OTC pain medications. Pt reports feeling swimmy headed, knocked out, or extremely nervous when she has taken pain medications.     Objective: Physical Exam  General: Sophia Hancock is a pleasant 87 y.o. Caucasian female, in NAD. AAO x 3.   Vascular:  Capillary refill time to digits immediate b/l. Palpable DP pulses b/l. Palpable PT pulses b/l. Pedal hair absent b/l Skin temperature gradient within normal limits b/l.  Dermatological:  Pedal skin is thin shiny, atrophic b/l lower extremities. No open wounds bilaterally. No interdigital macerations bilaterally. Toenails 1-5 b/l elongated, discolored, dystrophic, thickened, crumbly with subungual debris and tenderness to dorsal palpation. Porokeratotic lesion(s) L 3rd toe. No erythema, no edema, no drainage, no flocculence. There is tenderness to palpation.  Musculoskeletal:  Normal muscle strength 5/5 to all lower extremity muscle groups bilaterally. No pain crepitus or joint limitation noted with ROM b/l. Hammertoes noted to the L 3rd toe.  Neurological:  Protective sensation intact 5/5 intact bilaterally with 10g monofilament b/l.  Assessment and Plan:  1. Pain due to onychomycosis of toenail   2. Porokeratosis   3. Pain of toe of left foot    -Examined patient. -Medicare ABN signed for this year. Copy given to patient on today's visit and copy placed in patient's chart. -Painful porokeratotic lesion(s) L 3rd toe pared and enucleated with sterile scalpel blade without incident.  Patient related relief after treatment on today's visit. -Patient to report any pedal injuries to medical professional immediately. -Dispensed leather toe crest for left foot. Apply every  morning. Remove every evening. -Patient to continue soft, supportive shoe gear daily. -Patient/POA to call should there be question/concern in the  interim.  Return in about 3 months (around 12/18/2020).  Marzetta Board, DPM

## 2020-11-01 ENCOUNTER — Other Ambulatory Visit: Payer: Self-pay

## 2020-11-01 ENCOUNTER — Ambulatory Visit (INDEPENDENT_AMBULATORY_CARE_PROVIDER_SITE_OTHER): Payer: Medicare Other | Admitting: Podiatry

## 2020-11-01 DIAGNOSIS — B351 Tinea unguium: Secondary | ICD-10-CM

## 2020-11-01 DIAGNOSIS — M79674 Pain in right toe(s): Secondary | ICD-10-CM

## 2020-11-01 DIAGNOSIS — L603 Nail dystrophy: Secondary | ICD-10-CM

## 2020-11-06 ENCOUNTER — Telehealth: Payer: Self-pay | Admitting: *Deleted

## 2020-11-06 NOTE — Telephone Encounter (Signed)
Sophia Hancock is calling with question concerning an entire toenail that was removed on last week(Wed.), How long should she keep using the silvadene on the toe?Please advise.

## 2020-11-08 ENCOUNTER — Encounter: Payer: Self-pay | Admitting: Podiatry

## 2020-11-13 NOTE — Progress Notes (Signed)
   HPI: 87 y.o. female presenting today for new complaint regarding pain and tenderness of the right second toe.  She states that her nail is very thick and is sore.  She experiences redness and is very tender to touch with certain shoe gear.  She would like to have it addressed.  She presents for further treatment and evaluation  Past Medical History:  Diagnosis Date  . Arthritis    per Dermatologist records  . Basal cell carcinoma    per Dermatologist records  . Cancer ([place])    skin , place on left arm and nose  . Cerumen impaction 12/03/2011  . Chicken pox as a child  . Hearing loss 12/03/2011  . Hemorrhoids   . Hyperglycemia 02/06/2013  . Hypertension   . Internal hemorrhoids with bleeding and pain 04/13/2013  . Joint pain   . Measles as a child  . Mumps as a child  . Osteoarthritis of right hip 04/26/2013  . Overweight(278.02) 12/03/2011  . Preventative health care 12/03/2011  . Pulse fast   . SVT (supraventricular tachycardia) ([facility])    h/o  . Thyroid disorder      Physical Exam: General: The patient is alert and oriented x3 in no acute distress.  Dermatology: Skin is warm, dry and supple bilateral lower extremities. Negative for open lesions or macerations.  Hyperkeratotic dystrophic sensitive nail noted to the right second toe nail plate.  Associated tenderness to palpation.  Vascular: Palpable pedal pulses bilaterally. No edema or erythema noted. Capillary refill within normal limits.  Neurological: Epicritic and protective threshold grossly intact bilaterally.   Musculoskeletal Exam: Range of motion within normal limits to all pedal and ankle joints bilateral. Muscle strength 5/5 in all groups bilateral.  Tenderness to palpation directly with the nail plate to the right second toe  Assessment: 1.  Dystrophic nail right second toe   Plan of Care:  1. Patient evaluated.  2.  Today we discussed different treatment options including debridement for total temporary nail  avulsion.  The patient would like to have the nail completely removed and allow for a new nail to grow in.  After discussing her different treatment options I do agree this may be the best way to alleviate her symptoms 3.  The toe was prepped in aseptic manner and digital block performed using 3 mL of 2% lidocaine plain 4.  Nail was avulsed in toto and light dressing was applied post care instructions were provided. 5.  Silvadene cream provided to apply 2 times daily 6.  Return to clinic in 2 weeks      Edrick Kins, DPM [facility]  Dr. Edrick Kins, DPM    [address], [place] [address]                Office [phone]  Fax [phone]

## 2020-11-15 ENCOUNTER — Other Ambulatory Visit: Payer: Self-pay

## 2020-11-15 ENCOUNTER — Ambulatory Visit (INDEPENDENT_AMBULATORY_CARE_PROVIDER_SITE_OTHER): Payer: Medicare Other | Admitting: Podiatry

## 2020-11-15 DIAGNOSIS — M79674 Pain in right toe(s): Secondary | ICD-10-CM

## 2020-11-15 DIAGNOSIS — B351 Tinea unguium: Secondary | ICD-10-CM

## 2020-11-15 DIAGNOSIS — L603 Nail dystrophy: Secondary | ICD-10-CM

## 2020-11-15 DIAGNOSIS — M79675 Pain in left toe(s): Secondary | ICD-10-CM

## 2020-11-15 NOTE — Progress Notes (Signed)
   Subjective: 87 y.o. female presents today status post total temporary nail avulsion procedure of the right second toe that was performed on 11/01/2020.  Patient states that she feels much better.  She has been soaking her foot and applying antibiotic cream as instructed.  No new complaints at this time  Past Medical History:  Diagnosis Date  . Arthritis    per Dermatologist records  . Basal cell carcinoma    per Dermatologist records  . Cancer ([place])    skin , place on left arm and nose  . Cerumen impaction 12/03/2011  . Chicken pox as a child  . Hearing loss 12/03/2011  . Hemorrhoids   . Hyperglycemia 02/06/2013  . Hypertension   . Internal hemorrhoids with bleeding and pain 04/13/2013  . Joint pain   . Measles as a child  . Mumps as a child  . Osteoarthritis of right hip 04/26/2013  . Overweight(278.02) 12/03/2011  . Preventative health care 12/03/2011  . Pulse fast   . SVT (supraventricular tachycardia) ([facility])    h/o  . Thyroid disorder     Objective: Skin is warm, dry and supple. Nail and respective nail bed appears to be healing appropriately.  Negative for any significant drainage.  Nailbed appears healthy with routine healing.  No sign of infection.  No malodor noted.  Assessment: #1 postop total temporary nail avulsion right second toe  Plan of care: #1 patient was evaluated  #2  Light debridement of the area was performed using a tissue nipper without incident or bleeding #3 recommend good supportive shoes 4.  Return to clinic in 3 months for routine foot care   Edrick Kins, DPM [facility]  Dr. Edrick Kins, [address]                                        [place], [place] [address]                Office [phone]  Fax [phone]

## 2021-01-01 ENCOUNTER — Ambulatory Visit (INDEPENDENT_AMBULATORY_CARE_PROVIDER_SITE_OTHER): Payer: Medicare Other | Admitting: Podiatry

## 2021-01-01 ENCOUNTER — Encounter: Payer: Self-pay | Admitting: Podiatry

## 2021-01-01 ENCOUNTER — Other Ambulatory Visit: Payer: Self-pay

## 2021-01-01 DIAGNOSIS — M2042 Other hammer toe(s) (acquired), left foot: Secondary | ICD-10-CM

## 2021-01-01 DIAGNOSIS — L84 Corns and callosities: Secondary | ICD-10-CM

## 2021-01-01 DIAGNOSIS — M79675 Pain in left toe(s): Secondary | ICD-10-CM | POA: Diagnosis not present

## 2021-01-01 DIAGNOSIS — M79674 Pain in right toe(s): Secondary | ICD-10-CM | POA: Diagnosis not present

## 2021-01-01 DIAGNOSIS — Q828 Other specified congenital malformations of skin: Secondary | ICD-10-CM

## 2021-01-01 DIAGNOSIS — B351 Tinea unguium: Secondary | ICD-10-CM

## 2021-01-07 NOTE — Progress Notes (Signed)
  Subjective:  Patient ID: Sophia Hancock, female    DOB: May 03, 1928,  MRN: [id number]  Sophia Hancock presents to clinic today for painful thick toenails that are difficult to trim. Pain interferes with ambulation. Aggravating factors include wearing enclosed shoe gear. Pain is relieved with periodic professional debridement.   Her daughter is present during today's visit. Daughter states Dr. Amalia Hailey removed right 2nd digit toenail. They voice no new pedal problems on today's visit.  PCP is Dr. Talbert Forest A. Corrington and last visit was 06/06/2020.  Allergies  Allergen Reactions  . Tylenol [Acetaminophen]     Pt reports being very sensitive to all pain medications, including OTC pain medications. Pt reports feeling swimmy headed, knocked out, or extremely nervous when she has taken pain medications.     Review of Systems: Negative except as noted in the HPI. Objective:   Constitutional Sophia Hancock is a pleasant 87 y.o. Caucasian female, in NAD. AAO x 3.   Vascular Capillary refill time to digits immediate b/l. Palpable pedal pulses b/l LE. Pedal hair absent. Lower extremity skin temperature gradient within normal limits. No cyanosis or clubbing noted.  Neurologic Normal speech. Oriented to person, place, and time. Protective sensation intact 5/5 intact bilaterally with 10g monofilament b/l.  Dermatologic Pedal skin is thin shiny, atrophic b/l lower extremities. No open wounds bilaterally. No interdigital macerations bilaterally. Toenails 1-5 left, R hallux, R 3rd toe, R 4th toe and R 5th toe elongated, discolored, dystrophic, thickened, and crumbly with subungual debris and tenderness to dorsal palpation.  Anonychia right 2nd digit.  Hyperkeratotic lesion(s) dorsal nailbed R 2nd toe. No erythema, no edema, no drainage, no fluctuance. Porokeratotic lesion(s) L 3rd toe. No erythema, no edema, no drainage, no fluctuance.  Orthopedic: Normal muscle strength 5/5 to all lower extremity muscle groups  bilaterally. No pain crepitus or joint limitation noted with ROM b/l. Hammertoes noted to the L 3rd toe.   Radiographs: None Assessment:   1. Pain due to onychomycosis of toenail of right foot   2. Porokeratosis   3. Callus   4. Hammertoe of left foot   5. Pain in toes of both feet    Plan:  Patient was evaluated and treated and all questions answered.  Onychomycosis with pain -Nails palliatively debridement as below -Educated on self-care  Procedure: Nail Debridement Rationale: Pain Type of Debridement: manual, sharp debridement. Instrumentation: Nail nipper, rotary burr. Number of Nails: 9 -Examined patient. -Medicare ABN signed for this year. Patient consents for services of paring of corns/callouses  today. Copy given to patient on today's visit and copy placed in patient's chart. -Patient to continue soft, supportive shoe gear daily. -Toenails 1-5 left, R hallux, R 3rd toe, R 4th toe and R 5th toe debrided in length and girth without iatrogenic bleeding with sterile nail nipper and dremel.  -Callus(es)of nailbed R 2nd toe pared utilizing sterile scalpel blade without complication or incident. Total number debrided =1. -Painful porokeratotic lesion(s) L 3rd toe pared and enucleated with sterile scalpel blade without incident. Total number pared=1. -Patient to report any pedal injuries to medical professional immediately. -Patient/POA to call should there be question/concern in the interim.  Return in about 3 months (around 04/03/2021).  Marzetta Board, DPM

## 2021-01-19 ENCOUNTER — Telehealth: Payer: Self-pay | Admitting: *Deleted

## 2021-01-19 NOTE — Telephone Encounter (Signed)
Scheduled appointment 11/15/20.

## 2021-04-03 ENCOUNTER — Other Ambulatory Visit: Payer: Self-pay | Admitting: Otolaryngology

## 2021-04-03 DIAGNOSIS — E041 Nontoxic single thyroid nodule: Secondary | ICD-10-CM

## 2021-04-04 ENCOUNTER — Ambulatory Visit (INDEPENDENT_AMBULATORY_CARE_PROVIDER_SITE_OTHER): Payer: Medicare Other | Admitting: Podiatry

## 2021-04-04 ENCOUNTER — Encounter: Payer: Self-pay | Admitting: Podiatry

## 2021-04-04 ENCOUNTER — Other Ambulatory Visit: Payer: Self-pay

## 2021-04-04 ENCOUNTER — Other Ambulatory Visit: Payer: Self-pay | Admitting: *Deleted

## 2021-04-04 DIAGNOSIS — Q828 Other specified congenital malformations of skin: Secondary | ICD-10-CM

## 2021-04-04 DIAGNOSIS — M79674 Pain in right toe(s): Secondary | ICD-10-CM | POA: Diagnosis not present

## 2021-04-04 DIAGNOSIS — B351 Tinea unguium: Secondary | ICD-10-CM

## 2021-04-04 DIAGNOSIS — M79675 Pain in left toe(s): Secondary | ICD-10-CM

## 2021-04-05 ENCOUNTER — Ambulatory Visit
Admission: RE | Admit: 2021-04-05 | Discharge: 2021-04-05 | Disposition: A | Discharge status: Home / Self Care | Payer: Medicare Other | Source: Ambulatory Visit | Attending: Otolaryngology | Admitting: Otolaryngology

## 2021-04-05 DIAGNOSIS — E041 Nontoxic single thyroid nodule: Secondary | ICD-10-CM

## 2021-04-08 NOTE — Progress Notes (Signed)
  Subjective:  Patient ID: Sophia Hancock, female    DOB: 1927-09-22,  MRN: [id number]  Sophia Hancock presents to clinic today for corn(s) left 3rd digit, right 2nd digit and painful thick toenails that are difficult to trim. Painful toenails interfere with ambulation. Aggravating factors include wearing enclosed shoe gear. Pain is relieved with periodic professional debridement. Painful corns are aggravated when weightbearing when wearing enclosed shoe gear. Pain is relieved with periodic professional debridement.  She voices no new pedal problems on today's visit.  PCP is Corrington, Kip A, MD , and last visit was 12/19/2020.  Allergies  Allergen Reactions   Tylenol [Acetaminophen]     Pt reports being very sensitive to all pain medications, including OTC pain medications. Pt reports feeling swimmy headed, knocked out, or extremely nervous when she has taken pain medications.     Review of Systems: Negative except as noted in the HPI. Objective:   Constitutional Sophia Hancock is a pleasant 87 y.o. Caucasian female, WD, WN in NAD. AAO x 3.   Vascular Capillary refill time to digits immediate b/l. Palpable DP pulse(s) b/l lower extremities Palpable PT pulse(s) b/l lower extremities Pedal hair absent. Lower extremity skin temperature gradient within normal limits. No cyanosis or clubbing noted.  Neurologic Normal speech. Oriented to person, place, and time. Protective sensation intact 5/5 intact bilaterally with 10g monofilament b/l.  Dermatologic Pedal skin is thin shiny, atrophic b/l lower extremities. No open wounds b/l lower extremities. No interdigital macerations b/l lower extremities. Toenails 1-5 left, R hallux, R 3rd toe, R 4th toe, and R 5th toe elongated, discolored, dystrophic, thickened, and crumbly with subungual debris and tenderness to dorsal palpation. Anonychia noted R 2nd toe. Nailbed(s) epithelialized.  Porokeratotic lesion(s) L 3rd toe. No erythema, no edema, no drainage, no  fluctuance.  Orthopedic: Normal muscle strength 5/5 to all lower extremity muscle groups bilaterally. No pain crepitus or joint limitation noted with ROM b/l lower extremities. Hammertoe(s) noted to the L 3rd toe.   Radiographs: None Assessment:   1. Pain due to onychomycosis of toenail of right foot   2. Porokeratosis   3. Pain in toes of both feet    Plan:  -Examined patient. -Medicare ABN signed for this year. Patient consents for services of paring of corns and calluses  today. Copy has been placed in patient's chart. -Patient to continue soft, supportive shoe gear daily. -Toenails 1-5 left, R hallux, R 3rd toe, R 4th toe, and R 5th toe debrided in length and girth without iatrogenic bleeding with sterile nail nipper and dremel.  -Painful porokeratotic lesion(s) L 3rd toe pared and enucleated with sterile scalpel blade without incident. Total number of lesions debrided=1. -Patient to report any pedal injuries to medical professional immediately. -Patient/POA to call should there be question/concern in the interim.  Return in about 3 months (around 07/05/2021).  Marzetta Board, DPM

## 2021-06-06 ENCOUNTER — Other Ambulatory Visit (HOSPITAL_BASED_OUTPATIENT_CLINIC_OR_DEPARTMENT_OTHER): Payer: Self-pay

## 2021-06-06 MED ORDER — FLUAD QUADRIVALENT 0.5 ML IM PRSY
PREFILLED_SYRINGE | INTRAMUSCULAR | 0 refills | Status: AC
  Filled 2021-06-06: qty 0.5, 1d supply, fill #0

## 2021-06-29 ENCOUNTER — Ambulatory Visit: Payer: Medicare Other

## 2021-07-06 ENCOUNTER — Ambulatory Visit: Payer: Medicare Other

## 2021-07-16 ENCOUNTER — Ambulatory Visit (INDEPENDENT_AMBULATORY_CARE_PROVIDER_SITE_OTHER): Payer: Medicare Other | Admitting: Podiatry

## 2021-07-16 ENCOUNTER — Other Ambulatory Visit: Payer: Self-pay

## 2021-07-16 DIAGNOSIS — B351 Tinea unguium: Secondary | ICD-10-CM

## 2021-07-16 DIAGNOSIS — Q828 Other specified congenital malformations of skin: Secondary | ICD-10-CM

## 2021-07-16 DIAGNOSIS — M79674 Pain in right toe(s): Secondary | ICD-10-CM | POA: Diagnosis not present

## 2021-07-20 ENCOUNTER — Encounter: Payer: Self-pay | Admitting: Podiatry

## 2021-07-20 NOTE — Progress Notes (Signed)
  Subjective:  Patient ID: Sophia Hancock, female    DOB: [birth year],  MRN: [id number]  87 y.o. female presents with painful porokeratotic lesion(s) left foot and painful mycotic toenails that limit ambulation. Painful toenails interfere with ambulation. Aggravating factors include wearing enclosed shoe gear. Pain is relieved with periodic professional debridement. Painful porokeratotic lesions are aggravated when weightbearing with and without shoegear. Pain is relieved with periodic professional debridement..    She is accompanied by her daughter on today's visit. They voice no new pedal concerns on today.  PCP: Corrington, Kip A, MD and last visit was: 12/19/2020.  Review of Systems: Negative except as noted in the HPI.   Allergies  Allergen Reactions   Tylenol [Acetaminophen]     Pt reports being very sensitive to all pain medications, including OTC pain medications. Pt reports feeling swimmy headed, knocked out, or extremely nervous when she has taken pain medications.     Objective:  There were no vitals filed for this visit. Constitutional Patient is a pleasant 87 y.o. Caucasian female WD, WN in NAD. AAO x 3.  Vascular Capillary fill time to digits immediate b/l.  DP/PT pulse(s) are palpable b/l lower extremities. Pedal hair absent b/l. Lower extremity skin temperature gradient within normal limits. No pain with calf compression b/l. No edema noted b/l lower extremities. No cyanosis or clubbing noted.   Neurologic Protective sensation intact 5/5 intact bilaterally with 10g monofilament b/l. No clonus b/l.   Dermatologic Pedal skin is warm and supple b/l.  No open wounds b/l lower extremities. No interdigital macerations b/l lower extremities. Toenails 1-5 b/l elongated, discolored, dystrophic, thickened, crumbly with subungual debris and tenderness to dorsal palpation. Porokeratotic lesion(s) left foot. No erythema, no edema, no drainage, no fluctuance.  Orthopedic: Normal muscle strength  5/5 to all lower extremity muscle groups bilaterally. Patient ambulates independent of any assistive aids. Hammertoe(s) noted to the left foot.    Assessment:   1. Pain due to onychomycosis of toenail of right foot   2. Porokeratosis    Plan:  Patient was evaluated and treated and all questions answered. Consent given for treatment as described below: -Examined patient. -Medicare ABN on file for paring of porokeratotic lesion. -Mycotic toenails 1-5 bilaterally were debrided in length and girth with sterile nail nippers and dremel without incident. -Painful porokeratotic lesion(s) left foot pared and enucleated with sterile scalpel blade without incident. Total number of lesions debrided=1. -Patient/POA to call should there be question/concern in the interim.  Return in about 3 months (around 10/16/2021).  Marzetta Board, DPM

## 2021-08-02 ENCOUNTER — Inpatient Hospital Stay (HOSPITAL_COMMUNITY)
Admission: EM | Admit: 2021-08-02 | Discharge: 2021-08-14 | DRG: 689 | Disposition: A | Discharge status: Skilled Nursing Facility | Payer: Medicare Other | Attending: Internal Medicine | Admitting: Internal Medicine

## 2021-08-02 ENCOUNTER — Emergency Department (HOSPITAL_COMMUNITY): Payer: Medicare Other

## 2021-08-02 ENCOUNTER — Encounter (HOSPITAL_COMMUNITY): Payer: Self-pay

## 2021-08-02 DIAGNOSIS — N390 Urinary tract infection, site not specified: Secondary | ICD-10-CM | POA: Diagnosis not present

## 2021-08-02 DIAGNOSIS — Z8249 Family history of ischemic heart disease and other diseases of the circulatory system: Secondary | ICD-10-CM

## 2021-08-02 DIAGNOSIS — H9190 Unspecified hearing loss, unspecified ear: Secondary | ICD-10-CM | POA: Diagnosis present

## 2021-08-02 DIAGNOSIS — E860 Dehydration: Secondary | ICD-10-CM | POA: Diagnosis present

## 2021-08-02 DIAGNOSIS — Z841 Family history of disorders of kidney and ureter: Secondary | ICD-10-CM

## 2021-08-02 DIAGNOSIS — G4700 Insomnia, unspecified: Secondary | ICD-10-CM | POA: Diagnosis present

## 2021-08-02 DIAGNOSIS — N183 Chronic kidney disease, stage 3 unspecified: Secondary | ICD-10-CM | POA: Diagnosis present

## 2021-08-02 DIAGNOSIS — I10 Essential (primary) hypertension: Secondary | ICD-10-CM | POA: Diagnosis present

## 2021-08-02 DIAGNOSIS — Y92003 Bedroom of unspecified non-institutional (private) residence as the place of occurrence of the external cause: Secondary | ICD-10-CM

## 2021-08-02 DIAGNOSIS — Z803 Family history of malignant neoplasm of breast: Secondary | ICD-10-CM

## 2021-08-02 DIAGNOSIS — W06XXXA Fall from bed, initial encounter: Secondary | ICD-10-CM | POA: Diagnosis present

## 2021-08-02 DIAGNOSIS — Z823 Family history of stroke: Secondary | ICD-10-CM

## 2021-08-02 DIAGNOSIS — S51011A Laceration without foreign body of right elbow, initial encounter: Secondary | ICD-10-CM | POA: Diagnosis present

## 2021-08-02 DIAGNOSIS — R4182 Altered mental status, unspecified: Secondary | ICD-10-CM

## 2021-08-02 DIAGNOSIS — Z9841 Cataract extraction status, right eye: Secondary | ICD-10-CM

## 2021-08-02 DIAGNOSIS — W19XXXA Unspecified fall, initial encounter: Secondary | ICD-10-CM

## 2021-08-02 DIAGNOSIS — Z9842 Cataract extraction status, left eye: Secondary | ICD-10-CM

## 2021-08-02 DIAGNOSIS — Z8261 Family history of arthritis: Secondary | ICD-10-CM

## 2021-08-02 DIAGNOSIS — F05 Delirium due to known physiological condition: Secondary | ICD-10-CM | POA: Diagnosis not present

## 2021-08-02 DIAGNOSIS — Z85828 Personal history of other malignant neoplasm of skin: Secondary | ICD-10-CM

## 2021-08-02 DIAGNOSIS — Z79899 Other long term (current) drug therapy: Secondary | ICD-10-CM

## 2021-08-02 DIAGNOSIS — M810 Age-related osteoporosis without current pathological fracture: Secondary | ICD-10-CM | POA: Diagnosis present

## 2021-08-02 DIAGNOSIS — F03918 Unspecified dementia, unspecified severity, with other behavioral disturbance: Secondary | ICD-10-CM | POA: Diagnosis present

## 2021-08-02 DIAGNOSIS — U071 COVID-19: Secondary | ICD-10-CM | POA: Diagnosis present

## 2021-08-02 DIAGNOSIS — I471 Supraventricular tachycardia: Secondary | ICD-10-CM | POA: Diagnosis present

## 2021-08-02 DIAGNOSIS — Z66 Do not resuscitate: Secondary | ICD-10-CM | POA: Diagnosis present

## 2021-08-02 DIAGNOSIS — I129 Hypertensive chronic kidney disease with stage 1 through stage 4 chronic kidney disease, or unspecified chronic kidney disease: Secondary | ICD-10-CM | POA: Diagnosis present

## 2021-08-02 DIAGNOSIS — R32 Unspecified urinary incontinence: Secondary | ICD-10-CM | POA: Diagnosis present

## 2021-08-02 DIAGNOSIS — Z825 Family history of asthma and other chronic lower respiratory diseases: Secondary | ICD-10-CM

## 2021-08-02 DIAGNOSIS — K5900 Constipation, unspecified: Secondary | ICD-10-CM | POA: Diagnosis not present

## 2021-08-02 DIAGNOSIS — N3000 Acute cystitis without hematuria: Secondary | ICD-10-CM

## 2021-08-02 DIAGNOSIS — G9341 Metabolic encephalopathy: Secondary | ICD-10-CM | POA: Diagnosis present

## 2021-08-02 DIAGNOSIS — F0392 Unspecified dementia, unspecified severity, with psychotic disturbance: Secondary | ICD-10-CM | POA: Diagnosis present

## 2021-08-02 LAB — URINALYSIS, ROUTINE W REFLEX MICROSCOPIC
Bilirubin Urine: NEGATIVE
Glucose, UA: NEGATIVE mg/dL
Ketones, ur: 5 mg/dL — AB
Nitrite: POSITIVE — AB
Protein, ur: NEGATIVE mg/dL
Specific Gravity, Urine: 1.015 (ref 1.005–1.030)
pH: 8 (ref 5.0–8.0)

## 2021-08-02 LAB — COMPREHENSIVE METABOLIC PANEL
ALT: 16 U/L (ref 0–44)
AST: 31 U/L (ref 15–41)
Albumin: 3.6 g/dL (ref 3.5–5.0)
Alkaline Phosphatase: 73 U/L (ref 38–126)
Anion gap: 5 (ref 5–15)
BUN: 24 mg/dL — ABNORMAL HIGH (ref 8–23)
CO2: 27 mmol/L (ref 22–32)
Calcium: 8.4 mg/dL — ABNORMAL LOW (ref 8.9–10.3)
Chloride: 103 mmol/L (ref 98–111)
Creatinine, Ser: 0.86 mg/dL (ref 0.44–1.00)
GFR, Estimated: >60 mL/min (ref >60)
Glucose, Bld: 96 mg/dL (ref 70–99)
Potassium: 3.9 mmol/L (ref 3.5–5.1)
Sodium: 135 mmol/L (ref 135–145)
Total Bilirubin: 0.8 mg/dL (ref 0.3–1.2)
Total Protein: 6.1 g/dL — ABNORMAL LOW (ref 6.5–8.1)

## 2021-08-02 LAB — CBC
HCT: 37.6 % (ref 36.0–46.0)
Hemoglobin: 12.6 g/dL (ref 12.0–15.0)
MCH: 32.6 pg (ref 26.0–34.0)
MCHC: 33.5 g/dL (ref 30.0–36.0)
MCV: 97.2 fL (ref 80.0–100.0)
Platelets: 169 10*3/uL (ref 150–400)
RBC: 3.87 MIL/uL (ref 3.87–5.11)
RDW: 13.1 % (ref 11.5–15.5)
WBC: 6.5 10*3/uL (ref 4.0–10.5)
nRBC: 0 % (ref 0.0–0.2)

## 2021-08-02 LAB — CBC WITH DIFFERENTIAL/PLATELET
Abs Immature Granulocytes: 0.02 10*3/uL (ref 0.00–0.07)
Basophils Absolute: 0 10*3/uL (ref 0.0–0.1)
Basophils Relative: 0 %
Eosinophils Absolute: 0 10*3/uL (ref 0.0–0.5)
Eosinophils Relative: 0 %
HCT: 40.1 % (ref 36.0–46.0)
Hemoglobin: 13.4 g/dL (ref 12.0–15.0)
Immature Granulocytes: 0 %
Lymphocytes Relative: 10 %
Lymphs Abs: 0.7 10*3/uL (ref 0.7–4.0)
MCH: 32.7 pg (ref 26.0–34.0)
MCHC: 33.4 g/dL (ref 30.0–36.0)
MCV: 97.8 fL (ref 80.0–100.0)
Monocytes Absolute: 0.9 10*3/uL (ref 0.1–1.0)
Monocytes Relative: 12 %
Neutro Abs: 5.5 10*3/uL (ref 1.7–7.7)
Neutrophils Relative %: 78 %
Platelets: 170 10*3/uL (ref 150–400)
RBC: 4.1 MIL/uL (ref 3.87–5.11)
RDW: 13.1 % (ref 11.5–15.5)
WBC: 7.1 10*3/uL (ref 4.0–10.5)
nRBC: 0 % (ref 0.0–0.2)

## 2021-08-02 LAB — LACTIC ACID, PLASMA: Lactic Acid, Venous: 1 mmol/L (ref 0.5–1.9)

## 2021-08-02 LAB — CK: Total CK: 390 U/L — ABNORMAL HIGH (ref 38–234)

## 2021-08-02 LAB — CREATININE, SERUM
Creatinine, Ser: 0.66 mg/dL (ref 0.44–1.00)
GFR, Estimated: >60 mL/min (ref >60)

## 2021-08-02 MED ORDER — DOCUSATE SODIUM 100 MG PO CAPS
100.0000 mg | ORAL_CAPSULE | Freq: Two times a day (BID) | ORAL | Status: DC
  Administered 2021-08-03 – 2021-08-14 (×18): 100 mg via ORAL
  Filled 2021-08-02 (×21): qty 1

## 2021-08-02 MED ORDER — SODIUM CHLORIDE 0.9 % IV SOLN
1.0000 g | Freq: Once | INTRAVENOUS | Status: AC
  Administered 2021-08-02: 1 g via INTRAVENOUS
  Filled 2021-08-02: qty 10

## 2021-08-02 MED ORDER — LORAZEPAM 2 MG/ML IJ SOLN
0.5000 mg | Freq: Once | INTRAMUSCULAR | Status: AC
  Administered 2021-08-02: 0.5 mg via INTRAVENOUS
  Filled 2021-08-02: qty 1

## 2021-08-02 MED ORDER — ENOXAPARIN SODIUM 40 MG/0.4ML IJ SOSY
40.0000 mg | PREFILLED_SYRINGE | INTRAMUSCULAR | Status: DC
  Administered 2021-08-02 – 2021-08-14 (×13): 40 mg via SUBCUTANEOUS
  Filled 2021-08-02 (×13): qty 0.4

## 2021-08-02 MED ORDER — ONDANSETRON HCL 4 MG PO TABS: 4.0000 mg | ORAL_TABLET | Freq: Four times a day (QID) | ORAL | Status: DC | PRN

## 2021-08-02 MED ORDER — SODIUM CHLORIDE 0.9 % IV SOLN: INTRAVENOUS | Status: DC

## 2021-08-02 MED ORDER — HYDRALAZINE HCL 20 MG/ML IJ SOLN: 10.0000 mg | Freq: Three times a day (TID) | INTRAMUSCULAR | Status: DC | PRN

## 2021-08-02 MED ORDER — IBUPROFEN 200 MG PO TABS
400.0000 mg | ORAL_TABLET | Freq: Four times a day (QID) | ORAL | Status: DC | PRN
  Administered 2021-08-07 – 2021-08-10 (×3): 400 mg via ORAL
  Filled 2021-08-02 (×4): qty 2

## 2021-08-02 MED ORDER — SODIUM CHLORIDE 0.9 % IV SOLN
1.0000 g | INTRAVENOUS | Status: DC
  Administered 2021-08-03 – 2021-08-05 (×3): 1 g via INTRAVENOUS
  Filled 2021-08-02 (×3): qty 10

## 2021-08-02 MED ORDER — ONDANSETRON HCL 4 MG/2ML IJ SOLN: 4.0000 mg | Freq: Four times a day (QID) | INTRAMUSCULAR | Status: DC | PRN

## 2021-08-02 NOTE — ED Notes (Addendum)
This nurse discussed with pt's daughter the pt is only able to have clear liquids per the hospitalist's order. Daughter expressed understanding.

## 2021-08-02 NOTE — H&P (Signed)
History and Physical    Sophia Hancock:[id number] DOB: 1928-04-07 DOA: 08/02/2021  PCP: Curly Rim, MD   Patient coming from: Home  I have personally briefly reviewed patient's old medical records in [place]  Chief Complaint: Altered mental status.  HPI: Sophia Hancock is a 87 y.o. female with PMH significant of dementia, hypertension, hard of hearing, arthritis, history of SVT, brought in by EMS from home with altered mental status.  Patient was found under the bed by her family, not sure how long she was under the bed.  Assuming she might have fallen from bed and remained there throughout night.  She was found to have skin tears on the right elbow, She is not on any blood thinners. Of note patient has dementia but she is pretty functional, she attends [employer] [facility] during daytime.  Patient has acquired COVID infection 2 days ago from [place].  Her symptoms were very mild,  daughter reports her having runny nose and slight congestion, denies any cough or shortness of breath.  Daughter reports they found her confused after they found her on the floor in the morning. Patient is having hallucinations, talking weird stuff.  ED Course: She is hemodynamically stable except hypertension. HR 88, RR 18, BP 153/83, SPO2 100% on room air, temp 97.6 Labs include sodium 135, potassium 3.9, chloride 103, bicarb 27, glucose 96, BUN 24, creatinine 0.86, calcium 8.4, anion gap 5, alkaline phosphatase 73, albumin 3.6, AST 31, ALT 16, total protein 6.1, CK3 90, WBC 7.1, hemoglobin 13.4, hematocrit 40.1, MCV 97.8, platelet 170, UA: Cloudy, Hb+, leukocyte+, nitrites+, ketones+, bacteria+ CT head no acute abnormality, CT C-spine motion degradation, no subluxation or fracture noted X-ray chest: No acute abnormality, x-ray right elbow no acute fracture or dislocation. X-ray bilateral hip: Without any fractures  Review of Systems:  Review of Systems  Constitutional: Negative.    HENT:  Positive for congestion and hearing loss.   Eyes: Negative.   Respiratory: Negative.    Cardiovascular: Negative.   Gastrointestinal: Negative.   Musculoskeletal:  Positive for falls.  Skin: Negative.   Neurological:        Confusion, altered mentation.  Endo/Heme/Allergies: Negative.   Psychiatric/Behavioral:  Positive for hallucinations. The patient is nervous/anxious.    Past Medical History:  Diagnosis Date   Arthritis    per Dermatologist records   Basal cell carcinoma    per Dermatologist records   Cancer [facility])    skin , place on left arm and nose   Cerumen impaction 12/03/2011   Chicken pox as a child   Hearing loss 12/03/2011   Hemorrhoids    Hyperglycemia 02/06/2013   Hypertension    Internal hemorrhoids with bleeding and pain 04/13/2013   Joint pain    Measles as a child   Mumps as a child   Osteoarthritis of right hip 04/26/2013   Overweight(278.02) 12/03/2011   Preventative health care 12/03/2011   Pulse fast    SVT (supraventricular tachycardia) (HCC)    h/o   Thyroid disorder     Past Surgical History:  Procedure Laterality Date   BREAST SURGERY     benign X 2, b/l   CATARACT EXTRACTION, BILATERAL     one eye was done twice   DILATION AND CURETTAGE OF UTERUS     TONSILLECTOMY       reports that she has never smoked. She has never used smokeless tobacco. She reports that she does not drink alcohol and does  not use drugs.  Allergies  Allergen Reactions   Tylenol [Acetaminophen]     Pt reports being very sensitive to all pain medications, including OTC pain medications. Pt reports feeling swimmy headed, knocked out, or extremely nervous when she has taken pain medications.     Family History  Problem Relation Age of Onset   Hypertension Mother    Anemia Mother    Kidney disease Mother    Arthritis Father        severe   Aneurysm Sister        aortic   Breast cancer Sister    Heart disease Sister        aortic aneurysm   Kidney disease Sister         dialysis   COPD Sister    Aneurysm Brother        adbominal   Aortic aneurysm Brother    Stroke Paternal Grandfather    Cholecystitis Daughter    Family history reviewed and not pertinent.  Prior to Admission medications   Medication Sig Start Date End Date Taking? Authorizing Provider  azithromycin (ZITHROMAX) 250 MG tablet Take 250 mg by mouth as directed. Started on 07-31-21 ( 500 mg on day 1) 07/31/21  Yes [provider]  Carboxymeth-Glycerin-Polysorb (REFRESH OPTIVE ADVANCED) 0.5-1-0.5 % SOLN Apply 1 drop to eye daily as needed (dry eyes).   Yes [provider]  hydrochlorothiazide (MICROZIDE) 12.5 MG capsule TAKE ONE CAPSULE BY MOUTH EVERY MORNING 09/05/14  Yes Mosie Lukes, MD  ibuprofen (ADVIL,MOTRIN) 200 MG tablet Take 200 mg by mouth every 6 (six) hours as needed for headache or mild pain.   Yes [provider]  influenza vaccine adjuvanted (FLUAD QUADRIVALENT) 0.5 ML injection Inject into the muscle. 06/06/21       Physical Exam: Vitals:   08/02/21 1330 08/02/21 1400 08/02/21 1430 08/02/21 1526  BP: 126/76 127/73 (!) 104/56 (!) 125/114  Pulse: 92 85 88 88  Resp: 17 (!) 31 (!) 22 19  Temp:      TempSrc:      SpO2: 98% 99% 100% 100%  Weight:      Height:        Constitutional: Appears confused, comfortable, not in any distress. Vitals:   08/02/21 1330 08/02/21 1400 08/02/21 1430 08/02/21 1526  BP: 126/76 127/73 (!) 104/56 (!) 125/114  Pulse: 92 85 88 88  Resp: 17 (!) 31 (!) 22 19  Temp:      TempSrc:      SpO2: 98% 99% 100% 100%  Weight:      Height:       Eyes: PERRL, lids and conjunctivae normal ENMT: Mucous membranes are moist.  Posterior pharynx without exudate.  Normal dentition.  Neck: normal, supple, no masses, no thyromegaly Respiratory: Clear to auscultation bilaterally, no wheezing, no crackles, no accessory muscle use. Cardiovascular: S1-S2 heard, regular rate and rhythm, no murmur. Abdomen: Soft, nontender,  nondistended, BS+ Musculoskeletal: No edema, no cyanosis. No contractures. Normal muscle tone.  Skin: no rashes, lesions, ulcers. No induration Neurologic: Seems lethargic, not following commands.  Withdraws from painful stimuli. Psychiatric: Not Assessed.   Labs on Admission: I have personally reviewed following labs and imaging studies  CBC: Recent Labs  Lab 08/02/21 1217  WBC 7.1  NEUTROABS 5.5  HGB 13.4  HCT 40.1  MCV 97.8  PLT 712   Basic Metabolic Panel: Recent Labs  Lab 08/02/21 1217  NA 135  K 3.9  CL 103  CO2 27  GLUCOSE 96  BUN 24*  CREATININE 0.86  CALCIUM 8.4*   GFR: Estimated Creatinine Clearance: 35.2 mL/min (by C-G formula based on SCr of 0.86 mg/dL). Liver Function Tests: Recent Labs  Lab 08/02/21 1217  AST 31  ALT 16  ALKPHOS 73  BILITOT 0.8  PROT 6.1*  ALBUMIN 3.6   No results for input(s): LIPASE, AMYLASE in the last 168 hours. No results for input(s): AMMONIA in the last 168 hours. Coagulation Profile: No results for input(s): INR, PROTIME in the last 168 hours. Cardiac Enzymes: Recent Labs  Lab 08/02/21 1217  CKTOTAL 390*   BNP (last 3 results) No results for input(s): PROBNP in the last 8760 hours. HbA1C: No results for input(s): HGBA1C in the last 72 hours. CBG: No results for input(s): GLUCAP in the last 168 hours. Lipid Profile: No results for input(s): CHOL, HDL, LDLCALC, TRIG, CHOLHDL, LDLDIRECT in the last 72 hours. Thyroid Function Tests: No results for input(s): TSH, T4TOTAL, FREET4, T3FREE, THYROIDAB in the last 72 hours. Anemia Panel: No results for input(s): VITAMINB12, FOLATE, FERRITIN, TIBC, IRON, RETICCTPCT in the last 72 hours. Urine analysis:    Component Value Date/Time   COLORURINE YELLOW 08/02/2021 1217   APPEARANCEUR CLOUDY (A) 08/02/2021 1217   LABSPEC 1.015 08/02/2021 1217   PHURINE 8.0 08/02/2021 1217   GLUCOSEU NEGATIVE 08/02/2021 1217   HGBUR MODERATE (A) 08/02/2021 1217   BILIRUBINUR NEGATIVE  08/02/2021 1217   KETONESUR 5 (A) 08/02/2021 1217   PROTEINUR NEGATIVE 08/02/2021 1217   UROBILINOGEN 0.2 08/04/2009 1314   NITRITE POSITIVE (A) 08/02/2021 1217   LEUKOCYTESUR SMALL (A) 08/02/2021 1217    Radiological Exams on Admission: DG Chest 1 View  Result Date: 08/02/2021 CLINICAL DATA:  Fall. EXAM: CHEST  1 VIEW COMPARISON:  August 04, 2009. FINDINGS: Stable cardiomediastinal silhouette. Both lungs are clear. The visualized skeletal structures are unremarkable. IMPRESSION: No active disease. Electronically Signed   By: Marijo Conception M.D.   On: 08/02/2021 12:35   DG Elbow Complete Right  Result Date: 08/02/2021 CLINICAL DATA:  Fall EXAM: RIGHT ELBOW - COMPLETE 3+ VIEW COMPARISON:  None. FINDINGS: There is no evidence of fracture, dislocation, or joint effusion. There is no evidence of arthropathy or other focal bone abnormality. Soft tissues are unremarkable. IMPRESSION: Negative. Electronically Signed   By: Franchot Gallo M.D.   On: 08/02/2021 11:56   CT Head Wo Contrast  Result Date: 08/02/2021 CLINICAL DATA:  Unwitnessed fall, injury EXAM: CT HEAD WITHOUT CONTRAST TECHNIQUE: Contiguous axial images were obtained from the base of the skull through the vertex without intravenous contrast. COMPARISON:  01/23/2019 FINDINGS: Brain: Limited with motion artifact. Similar age-related atrophy and chronic white matter microvascular ischemic changes throughout both cerebral hemispheres. No acute intracranial hemorrhage, definite new mass lesion, new acute infarction, midline shift, herniation, hydrocephalus, or extra-axial fluid collection. No focal mass effect or edema. Cisterns are patent. Cerebellar atrophy as well. Vascular: Intracranial atherosclerosis.  No hyperdense vessel Skull: Normal. Negative for fracture or focal lesion. Sinuses/Orbits: No acute finding. Other: None. IMPRESSION: Limited with motion artifact. Stable atrophy and white matter microvascular changes. No significant  interval change or acute process by noncontrast CT. Electronically Signed   By: Jerilynn Mages.  Shick M.D.   On: 08/02/2021 13:21   CT Cervical Spine Wo Contrast  Result Date: 08/02/2021 CLINICAL DATA:  Fall from bed EXAM: CT CERVICAL SPINE WITHOUT CONTRAST TECHNIQUE: Multidetector CT imaging of the cervical spine was performed without intravenous contrast. Multiplanar CT image reconstructions were also generated.  COMPARISON:  Thyroid ultrasound, 04/05/2021 FINDINGS: Examination is very limited by pervasive motion artifact throughout, including on technical repeat acquisition. Alignment: Normal. Skull base and vertebrae: No acute fracture. No primary bone lesion or focal pathologic process. Soft tissues and spinal canal: No prevertebral fluid or swelling. No visible canal hematoma. Disc levels: Mild multilevel disc space height loss and osteophytosis Upper chest: Negative. Other: Enlarged left lobe of the thyroid, which deflects the trachea rightward, previously imaged by dedicated thyroid ultrasound. This has been evaluated on previous imaging. (ref: J Am Coll Radiol. 2015 Feb;12(2): 143-50). IMPRESSION: 1. Examination is very limited by pervasive motion artifact throughout, including on technical repeat acquisition. Within this limitation, no obvious fracture or static subluxation of the cervical spine. 2. Mild multilevel cervical disc degenerative disease. Electronically Signed   By: Delanna Ahmadi M.D.   On: 08/02/2021 13:25   DG Hips Bilat W or Wo Pelvis 3-4 Views  Result Date: 08/02/2021 CLINICAL DATA:  Fall EXAM: DG HIP (WITH OR WITHOUT PELVIS) 3-4V BILAT COMPARISON:  04/22/2013 FINDINGS: Negative for hip fracture.  Advanced osteoarthritis right hip. Negative for pelvic fracture. IMPRESSION: Negative for fracture.  Advanced degenerative change right hip Electronically Signed   By: Franchot Gallo M.D.   On: 08/02/2021 11:56    EKG: Independently reviewed.  Sinus rhythm, RBBB, LAFB  Assessment/Plan Principal  Problem:   Altered mental status Active Problems:   Hypertension   SVT (supraventricular tachycardia) (HCC)   Hearing loss   Urinary incontinence   Osteoporosis   Chronic kidney disease (CKD), stage III (moderate) (HCC)   Altered mental status could be multifactorial: Suspect multifactorial, could be UTI, COVID+, Dehydration , decreased PO Patient was found on the floor, unsure how long she was on floor. CK 395, not very high.  UA consistent with UTI. Continue IV hydration, Continue IV ceftriaxone. Follow-up urine culture. CT head unremarkable, CT C-spine unremarkable. Neurochecks every 4-6 hours.  UTI: UA consistent with UTI, Continue Ceftriaxone, follow-up urine culture. Patient does not meet sepsis criteria. Obtain lactic acid.  COVID+: Patient acquired COVID infection from [facility] Patient not having any significant respiratory symptoms. Continue airborne and droplet precautions. Continue supportive care  Fall: Suspect she might have fallen from bed and remained on the floor throughout night. Skeletal work-up completely unremarkable. CK slightly elevated continue gentle IV hydration. PT/ OT evaluation.  Essential hypertension: Hold hydrochlorothiazide, Start hydralazine as needed.   DVT prophylaxis: Lovenox Code Status: Full code. Family Communication:  Disposition Plan:   Status is: Observation  The patient remains OBS appropriate and will d/c before 2 midnights.  Altered mental status could be multifactorial.  Consults called: None Admission status: observation   Shawna Clamp MD [facility]   If 7PM-7AM, please contact night-coverage [email or web address]   08/02/2021, 4:49 PM

## 2021-08-02 NOTE — ED Notes (Signed)
Report received from [place], RN.

## 2021-08-02 NOTE — ED Provider Notes (Signed)
[facility] DEPT Provider Note   CSN: [id number] Arrival date & time: 08/02/21  1021     History Chief Complaint  Patient presents with   Sophia Hancock is a 87 y.o. female.  Patient brought in by EMS from home.  Patient found under the bed by family members.  Not sure how long she had been there.  But they do live with her.  Patient with a skin tear to right elbow area.  Patient not on blood thinners.  Patient diagnosed with COVID on Tuesday but symptoms have been very mild.  Just some runny nose and slight congestion.  Did have a raspy voice but that has improved.  Patient appears to have some component of some dementia or confusion.  Patient on azithromycin for the COVID diagnosis.  Only other medication is hydrochlorothiazide.  Patient is not on blood thinners.  EMS had placed patient in cervical collar.      Past Medical History:  Diagnosis Date   Arthritis    per Dermatologist records   Basal cell carcinoma    per Dermatologist records   Cancer [facility])    skin , place on left arm and nose   Cerumen impaction 12/03/2011   Chicken pox as a child   Hearing loss 12/03/2011   Hemorrhoids    Hyperglycemia 02/06/2013   Hypertension    Internal hemorrhoids with bleeding and pain 04/13/2013   Joint pain    Measles as a child   Mumps as a child   Osteoarthritis of right hip 04/26/2013   Overweight(278.02) 12/03/2011   Preventative health care 12/03/2011   Pulse fast    SVT (supraventricular tachycardia) (HCC)    h/o   Thyroid disorder     Patient Active Problem List   Diagnosis Date Noted   Chronic kidney disease (CKD), stage III (moderate) ([place]) 04/13/2019   Major neurocognitive disorder due to Alzheimer's disease, probable, without behavioral disturbance 12/11/2017   Hypertension, essential, benign 10/26/2017   Memory deficit 10/26/2017   Urge incontinence of urine 10/26/2017   Presbycusis of both ears 10/06/2017   Thyroid nodule 09/26/2017    Urinary incontinence 09/26/2017   Short-term memory loss 09/26/2017   Bilateral impacted cerumen 10/02/2016   Osteoporosis 12/15/2013   Left otitis media 10/08/2013   Osteoarthritis of right hip 04/26/2013   Internal hemorrhoids with bleeding and pain 04/13/2013   Hyperglycemia 02/06/2013   Overweight(278.02) 12/03/2011   Hearing loss 12/03/2011   Cerumen impaction 12/03/2011   Preventative health care 12/03/2011   Hypertension    Cancer (HCC)    Hemorrhoids    SVT (supraventricular tachycardia) ([place])    Foot pain 03/13/2011    Past Surgical History:  Procedure Laterality Date   BREAST SURGERY     benign X 2, b/l   CATARACT EXTRACTION, BILATERAL     one eye was done twice   DILATION AND CURETTAGE OF UTERUS     TONSILLECTOMY       OB History   No obstetric history on file.     Family History  Problem Relation Age of Onset   Hypertension Mother    Anemia Mother    Kidney disease Mother    Arthritis Father        severe   Aneurysm Sister        aortic   Breast cancer Sister    Heart disease Sister        aortic aneurysm   Kidney  disease Sister        dialysis   COPD Sister    Aneurysm Brother        adbominal   Aortic aneurysm Brother    Stroke Paternal Grandfather    Cholecystitis Daughter     Social History   Tobacco Use   Smoking status: Never   Smokeless tobacco: Never  Vaping Use   Vaping Use: Never used  Substance Use Topics   Alcohol use: No   Drug use: No    Home Medications Prior to Admission medications   Medication Sig Start Date End Date Taking? Authorizing Provider  azithromycin (ZITHROMAX) 250 MG tablet Take 250 mg by mouth as directed. Started on 07-31-21 ( 500 mg on day 1) 07/31/21  Yes [provider]  Carboxymeth-Glycerin-Polysorb (REFRESH OPTIVE ADVANCED) 0.5-1-0.5 % SOLN Apply 1 drop to eye daily as needed (dry eyes).   Yes [provider]  hydrochlorothiazide (MICROZIDE) 12.5 MG capsule TAKE ONE CAPSULE BY  MOUTH EVERY MORNING 09/05/14  Yes Mosie Lukes, MD  ibuprofen (ADVIL,MOTRIN) 200 MG tablet Take 200 mg by mouth every 6 (six) hours as needed for headache or mild pain.   Yes [provider]  influenza vaccine adjuvanted (FLUAD QUADRIVALENT) 0.5 ML injection Inject into the muscle. 06/06/21       Allergies    Tylenol [acetaminophen]  Review of Systems   Review of Systems  Unable to perform ROS: Dementia  HENT:  Positive for congestion.   Respiratory:  Positive for cough.   Skin:  Positive for wound.  All other systems reviewed and are negative.  Physical Exam Updated Vital Signs BP (!) 125/114   Pulse 88   Temp 97.6 F (36.4 C) (Oral)   Resp 19   Ht 1.6 m (5\' 3" )   Wt 61.2 kg   SpO2 100%   BMI 23.91 kg/m   Physical Exam Vitals and nursing note reviewed.  Constitutional:      General: She is not in acute distress.    Appearance: Normal appearance. She is well-developed.  HENT:     Head: Normocephalic and atraumatic.  Eyes:     Extraocular Movements: Extraocular movements intact.     Conjunctiva/sclera: Conjunctivae normal.     Pupils: Pupils are equal, round, and reactive to light.  Neck:     Comments: Cervical collar in place. Cardiovascular:     Rate and Rhythm: Normal rate and regular rhythm.     Heart sounds: No murmur heard. Pulmonary:     Effort: Pulmonary effort is normal. No respiratory distress.     Breath sounds: Normal breath sounds.  Abdominal:     Palpations: Abdomen is soft.     Tenderness: There is no abdominal tenderness.  Musculoskeletal:        General: Signs of injury present. No swelling.     Comments: Patient with 2 small skin tears about 1 cm each to right elbow.  Lots of bruising on upper extremities.  Family states that those are old.  Otherwise good range of motion passively with upper extremities lower extremities.  No evidence of any significant pain with movement.  Good cap refill.  Skin:    General: Skin is warm and dry.      Capillary Refill: Capillary refill takes less than 2 seconds.  Neurological:     General: No focal deficit present.     Mental Status: She is alert. Mental status is at baseline.  Psychiatric:  Mood and Affect: Mood normal.    ED Results / Procedures / Treatments   Labs (all labs ordered are listed, but only abnormal results are displayed) Labs Reviewed  COMPREHENSIVE METABOLIC PANEL - Abnormal; Notable for the following components:      Result Value   BUN 24 (*)    Calcium 8.4 (*)    Total Protein 6.1 (*)    All other components within normal limits  CK - Abnormal; Notable for the following components:   Total CK 390 (*)    All other components within normal limits  URINALYSIS, ROUTINE W REFLEX MICROSCOPIC - Abnormal; Notable for the following components:   APPearance CLOUDY (*)    Hgb urine dipstick MODERATE (*)    Ketones, ur 5 (*)    Nitrite POSITIVE (*)    Leukocytes,Ua SMALL (*)    Bacteria, UA MANY (*)    All other components within normal limits  URINE CULTURE  CBC WITH DIFFERENTIAL/PLATELET    EKG EKG Interpretation  Date/Time:  Thursday August 02 2021 10:42:43 EST Ventricular Rate:  82 PR Interval:  144 QRS Duration: 131 QT Interval:  404 QTC Calculation: 472 R Axis:   -60 Text Interpretation: Sinus rhythm RBBB and LAFB Confirmed by Fredia Sorrow [phone]) on 08/02/2021 12:31:02 PM  Radiology DG Chest 1 View  Result Date: 08/02/2021 CLINICAL DATA:  Fall. EXAM: CHEST  1 VIEW COMPARISON:  August 04, 2009. FINDINGS: Stable cardiomediastinal silhouette. Both lungs are clear. The visualized skeletal structures are unremarkable. IMPRESSION: No active disease. Electronically Signed   By: Marijo Conception M.D.   On: 08/02/2021 12:35   DG Elbow Complete Right  Result Date: 08/02/2021 CLINICAL DATA:  Fall EXAM: RIGHT ELBOW - COMPLETE 3+ VIEW COMPARISON:  None. FINDINGS: There is no evidence of fracture, dislocation, or joint effusion. There is no  evidence of arthropathy or other focal bone abnormality. Soft tissues are unremarkable. IMPRESSION: Negative. Electronically Signed   By: Franchot Gallo M.D.   On: 08/02/2021 11:56   CT Head Wo Contrast  Result Date: 08/02/2021 CLINICAL DATA:  Unwitnessed fall, injury EXAM: CT HEAD WITHOUT CONTRAST TECHNIQUE: Contiguous axial images were obtained from the base of the skull through the vertex without intravenous contrast. COMPARISON:  01/23/2019 FINDINGS: Brain: Limited with motion artifact. Similar age-related atrophy and chronic white matter microvascular ischemic changes throughout both cerebral hemispheres. No acute intracranial hemorrhage, definite new mass lesion, new acute infarction, midline shift, herniation, hydrocephalus, or extra-axial fluid collection. No focal mass effect or edema. Cisterns are patent. Cerebellar atrophy as well. Vascular: Intracranial atherosclerosis.  No hyperdense vessel Skull: Normal. Negative for fracture or focal lesion. Sinuses/Orbits: No acute finding. Other: None. IMPRESSION: Limited with motion artifact. Stable atrophy and white matter microvascular changes. No significant interval change or acute process by noncontrast CT. Electronically Signed   By: Jerilynn Mages.  Shick M.D.   On: 08/02/2021 13:21   CT Cervical Spine Wo Contrast  Result Date: 08/02/2021 CLINICAL DATA:  Fall from bed EXAM: CT CERVICAL SPINE WITHOUT CONTRAST TECHNIQUE: Multidetector CT imaging of the cervical spine was performed without intravenous contrast. Multiplanar CT image reconstructions were also generated. COMPARISON:  Thyroid ultrasound, 04/05/2021 FINDINGS: Examination is very limited by pervasive motion artifact throughout, including on technical repeat acquisition. Alignment: Normal. Skull base and vertebrae: No acute fracture. No primary bone lesion or focal pathologic process. Soft tissues and spinal canal: No prevertebral fluid or swelling. No visible canal hematoma. Disc levels: Mild multilevel  disc space height loss and  osteophytosis Upper chest: Negative. Other: Enlarged left lobe of the thyroid, which deflects the trachea rightward, previously imaged by dedicated thyroid ultrasound. This has been evaluated on previous imaging. (ref: J Am Coll Radiol. 2015 Feb;12(2): 143-50). IMPRESSION: 1. Examination is very limited by pervasive motion artifact throughout, including on technical repeat acquisition. Within this limitation, no obvious fracture or static subluxation of the cervical spine. 2. Mild multilevel cervical disc degenerative disease. Electronically Signed   By: Delanna Ahmadi M.D.   On: 08/02/2021 13:25   DG Hips Bilat W or Wo Pelvis 3-4 Views  Result Date: 08/02/2021 CLINICAL DATA:  Fall EXAM: DG HIP (WITH OR WITHOUT PELVIS) 3-4V BILAT COMPARISON:  04/22/2013 FINDINGS: Negative for hip fracture.  Advanced osteoarthritis right hip. Negative for pelvic fracture. IMPRESSION: Negative for fracture.  Advanced degenerative change right hip Electronically Signed   By: Franchot Gallo M.D.   On: 08/02/2021 11:56    Procedures Procedures   Medications Ordered in ED Medications  0.9 %  sodium chloride infusion ( Intravenous New Bag/Given 08/02/21 1213)  cefTRIAXone (ROCEPHIN) 1 g in sodium chloride 0.9 % 100 mL IVPB (0 g Intravenous Stopped 08/02/21 1348)  LORazepam (ATIVAN) injection 0.5 mg (0.5 mg Intravenous Given 08/02/21 1349)    ED Course  I have reviewed the triage vital signs and the nursing notes.  Pertinent labs & imaging results that were available during my care of the patient were reviewed by me and considered in my medical decision making (see chart for details).    MDM Rules/Calculators/A&P                         CRITICAL CARE Performed by: Fredia Sorrow Total critical care time: 35 minutes Critical care time was exclusive of separately billable procedures and treating other patients. Critical care was necessary to treat or prevent imminent or life-threatening  deterioration. Critical care was time spent personally by me on the following activities: development of treatment plan with patient and/or surrogate as well as nursing, discussions with consultants, evaluation of patient's response to treatment, examination of patient, obtaining history from patient or surrogate, ordering and performing treatments and interventions, ordering and review of laboratory studies, ordering and review of radiographic studies, pulse oximetry and re-evaluation of patient's condition.  Patient diagnosed with COVID on Tuesday.  Here are listed a lot of altered mental status and confusion.  Was tachycardic until she received fluids.  Also had to give 0.5 mg Ativan IV to get her settled down.  So that she would not remove IV lines.  Work-up significant for urinary tract infection.  Chest x-ray without any acute findings no leukocytosis.  Hemoglobin normal.  Complete metabolic panel without significant abnormalities.  GFR is greater than 60.  As stated in the HPI patient was on the floor for an unknown period of time but CK is not significantly elevated at 390.  As mentioned urinalysis is quite suggestive of urinary tract infection.  Urine culture sent.  Patient given a gram of Rocephin here.  CT head and neck without any acute injuries.  Cervical collar was removed.  Chest x-ray as mentioned without any acute findings.  X-rays of the pelvis and both hips related to the fall was negative and x-ray of right elbow was negative.  Wound care was provided to the right elbow by the nurse.  I think due to patient's markedly abnormal mental status.  Plus she is not really desiring to eat or drink patient  would benefit from admission continued IV fluids and IV antibiotics.  Suspect that the altered mental status is secondary to the urinary tract infection.  COVID be playing a small role.  But certainly no acute respiratory problems secondary to the COVID.  We will discussed with the  hospitalist.   Final Clinical Impression(s) / ED Diagnoses Final diagnoses:  Fall, initial encounter  COVID  Skin tear of right elbow without complication, initial encounter  Acute cystitis without hematuria  Altered mental status, unspecified altered mental status type    Rx / DC Orders ED Discharge Orders     None        Fredia Sorrow, MD 08/02/21 1548

## 2021-08-02 NOTE — ED Triage Notes (Signed)
Pt BIB [facility] from home for unwitnessed fall. Daughter found pt under bed. Mattress is 1-2 ft off floor. Alert to baseline. PERRLA. No obvious trauma or deformities. Some dried blood noted near nose and upper lip but cannot ascertain origin. Skin tears to right lateral elbow. Covid+ test on Tues, taking prophylactic azithromax.  BP 130/90 HR 60 RR 18 SpO2 99% CBG 104

## 2021-08-02 NOTE — ED Notes (Signed)
Pt's daughter adamant the pt be changed. Pt's daughter assisted this nurse in changing the pt's brief and placing chucks underneath the pt, as well as moving the pt upright in the stretcher. Purewick applied and hooked to suction, by this nurse.

## 2021-08-03 ENCOUNTER — Other Ambulatory Visit: Payer: Self-pay

## 2021-08-03 DIAGNOSIS — E860 Dehydration: Secondary | ICD-10-CM | POA: Diagnosis present

## 2021-08-03 DIAGNOSIS — Y92003 Bedroom of unspecified non-institutional (private) residence as the place of occurrence of the external cause: Secondary | ICD-10-CM | POA: Diagnosis not present

## 2021-08-03 DIAGNOSIS — Z79899 Other long term (current) drug therapy: Secondary | ICD-10-CM | POA: Diagnosis not present

## 2021-08-03 DIAGNOSIS — Z8261 Family history of arthritis: Secondary | ICD-10-CM | POA: Diagnosis not present

## 2021-08-03 DIAGNOSIS — Z9842 Cataract extraction status, left eye: Secondary | ICD-10-CM | POA: Diagnosis not present

## 2021-08-03 DIAGNOSIS — N183 Chronic kidney disease, stage 3 unspecified: Secondary | ICD-10-CM | POA: Diagnosis present

## 2021-08-03 DIAGNOSIS — F0392 Unspecified dementia, unspecified severity, with psychotic disturbance: Secondary | ICD-10-CM | POA: Diagnosis present

## 2021-08-03 DIAGNOSIS — R4182 Altered mental status, unspecified: Secondary | ICD-10-CM | POA: Diagnosis present

## 2021-08-03 DIAGNOSIS — H9190 Unspecified hearing loss, unspecified ear: Secondary | ICD-10-CM | POA: Diagnosis present

## 2021-08-03 DIAGNOSIS — G4700 Insomnia, unspecified: Secondary | ICD-10-CM | POA: Diagnosis present

## 2021-08-03 DIAGNOSIS — I129 Hypertensive chronic kidney disease with stage 1 through stage 4 chronic kidney disease, or unspecified chronic kidney disease: Secondary | ICD-10-CM | POA: Diagnosis present

## 2021-08-03 DIAGNOSIS — U071 COVID-19: Secondary | ICD-10-CM | POA: Diagnosis present

## 2021-08-03 DIAGNOSIS — Z823 Family history of stroke: Secondary | ICD-10-CM | POA: Diagnosis not present

## 2021-08-03 DIAGNOSIS — Z841 Family history of disorders of kidney and ureter: Secondary | ICD-10-CM | POA: Diagnosis not present

## 2021-08-03 DIAGNOSIS — Z9841 Cataract extraction status, right eye: Secondary | ICD-10-CM | POA: Diagnosis not present

## 2021-08-03 DIAGNOSIS — N3000 Acute cystitis without hematuria: Secondary | ICD-10-CM | POA: Diagnosis not present

## 2021-08-03 DIAGNOSIS — H9193 Unspecified hearing loss, bilateral: Secondary | ICD-10-CM | POA: Diagnosis not present

## 2021-08-03 DIAGNOSIS — S51011A Laceration without foreign body of right elbow, initial encounter: Secondary | ICD-10-CM | POA: Diagnosis present

## 2021-08-03 DIAGNOSIS — G9341 Metabolic encephalopathy: Secondary | ICD-10-CM | POA: Diagnosis present

## 2021-08-03 DIAGNOSIS — Z66 Do not resuscitate: Secondary | ICD-10-CM | POA: Diagnosis present

## 2021-08-03 DIAGNOSIS — W06XXXA Fall from bed, initial encounter: Secondary | ICD-10-CM | POA: Diagnosis present

## 2021-08-03 DIAGNOSIS — Z825 Family history of asthma and other chronic lower respiratory diseases: Secondary | ICD-10-CM | POA: Diagnosis not present

## 2021-08-03 DIAGNOSIS — F05 Delirium due to known physiological condition: Secondary | ICD-10-CM | POA: Diagnosis not present

## 2021-08-03 DIAGNOSIS — N390 Urinary tract infection, site not specified: Secondary | ICD-10-CM | POA: Diagnosis present

## 2021-08-03 DIAGNOSIS — K5900 Constipation, unspecified: Secondary | ICD-10-CM | POA: Diagnosis not present

## 2021-08-03 DIAGNOSIS — Z8249 Family history of ischemic heart disease and other diseases of the circulatory system: Secondary | ICD-10-CM | POA: Diagnosis not present

## 2021-08-03 DIAGNOSIS — F03918 Unspecified dementia, unspecified severity, with other behavioral disturbance: Secondary | ICD-10-CM | POA: Diagnosis present

## 2021-08-03 DIAGNOSIS — Z85828 Personal history of other malignant neoplasm of skin: Secondary | ICD-10-CM | POA: Diagnosis not present

## 2021-08-03 DIAGNOSIS — Z803 Family history of malignant neoplasm of breast: Secondary | ICD-10-CM | POA: Diagnosis not present

## 2021-08-03 LAB — RESP PANEL BY RT-PCR (FLU A&B, COVID) ARPGX2
Influenza A by PCR: NEGATIVE
Influenza B by PCR: NEGATIVE
SARS Coronavirus 2 by RT PCR: POSITIVE — AB

## 2021-08-03 LAB — COMPREHENSIVE METABOLIC PANEL
ALT: 21 U/L (ref 0–44)
AST: 49 U/L — ABNORMAL HIGH (ref 15–41)
Albumin: 3.3 g/dL — ABNORMAL LOW (ref 3.5–5.0)
Alkaline Phosphatase: 68 U/L (ref 38–126)
Anion gap: 10 (ref 5–15)
BUN: 14 mg/dL (ref 8–23)
CO2: 22 mmol/L (ref 22–32)
Calcium: 8.2 mg/dL — ABNORMAL LOW (ref 8.9–10.3)
Chloride: 103 mmol/L (ref 98–111)
Creatinine, Ser: 0.62 mg/dL (ref 0.44–1.00)
GFR, Estimated: >60 mL/min (ref >60)
Glucose, Bld: 95 mg/dL (ref 70–99)
Potassium: 3.5 mmol/L (ref 3.5–5.1)
Sodium: 135 mmol/L (ref 135–145)
Total Bilirubin: 0.9 mg/dL (ref 0.3–1.2)
Total Protein: 5.9 g/dL — ABNORMAL LOW (ref 6.5–8.1)

## 2021-08-03 LAB — CBC
HCT: 40.9 % (ref 36.0–46.0)
Hemoglobin: 13.7 g/dL (ref 12.0–15.0)
MCH: 32.4 pg (ref 26.0–34.0)
MCHC: 33.5 g/dL (ref 30.0–36.0)
MCV: 96.7 fL (ref 80.0–100.0)
Platelets: 179 10*3/uL (ref 150–400)
RBC: 4.23 MIL/uL (ref 3.87–5.11)
RDW: 13 % (ref 11.5–15.5)
WBC: 5.7 10*3/uL (ref 4.0–10.5)
nRBC: 0 % (ref 0.0–0.2)

## 2021-08-03 LAB — URINE CULTURE

## 2021-08-03 LAB — MAGNESIUM: Magnesium: 1.9 mg/dL (ref 1.7–2.4)

## 2021-08-03 LAB — PHOSPHORUS: Phosphorus: 2.4 mg/dL — ABNORMAL LOW (ref 2.5–4.6)

## 2021-08-03 MED ORDER — SODIUM CHLORIDE 0.9 % IV SOLN: INTRAVENOUS | Status: AC

## 2021-08-03 MED ORDER — LORAZEPAM 2 MG/ML IJ SOLN
0.2500 mg | Freq: Once | INTRAMUSCULAR | Status: AC
  Administered 2021-08-03: 0.25 mg via INTRAVENOUS
  Filled 2021-08-03: qty 1

## 2021-08-03 MED ORDER — QUETIAPINE FUMARATE 25 MG PO TABS
25.0000 mg | ORAL_TABLET | Freq: Once | ORAL | Status: AC
  Administered 2021-08-03: 25 mg via ORAL
  Filled 2021-08-03: qty 1

## 2021-08-03 NOTE — Evaluation (Signed)
Occupational Therapy Evaluation Patient Details Name: Sophia Hancock MRN: [id number] DOB: 07-Apr-1928 Today's Date: 08/03/2021   History of Present Illness Sophia Hancock is a 87 y.o. female brought in by EMS from home with altered mental status-hallucinations.  Patient was found under the bed by her family, not sure how long she was under the bed.  Assuming she might have fallen from bed and remained there throughout night.  She was found to have skin tears on the right elbow, found to have UTI. PMH dementia, hypertension, hard of hearing, arthritis, history of SVT. Of note patient has dementia but she is pretty functional, she attends [employer] [facility] during daytime.  Patient has acquired COVID infection 2 days ago from [place].  Her symptoms were very mild,  daughter reports her having runny nose and slight congestion.   Clinical Impression   This 87 yo female admitted with above presents to acute OT with PLOF of being able to bath herself in standing once A'd into shower, dress herself with occasional A, and toilet herself. She currently is total A for all basic ADLs and Total A to come up to sit and back to supine with Max A for sitting balance at EOB. She will continue to benefit from acute OT with follow up [facility] depending on clearing of mentation.      Recommendations for follow up therapy are one component of a multi-disciplinary discharge planning process, led by the attending physician.  Recommendations may be updated based on patient status, additional functional criteria and insurance authorization.   Follow Up Recommendations  Skilled nursing-short term rehab (<3 hours/day) (v [facility] depending on mentation)    Assistance Recommended at Discharge Frequent or constant Supervision/Assistance  Functional Status Assessment  Patient has had a recent decline in their functional status and/or demonstrates limited ability to make significant improvements in function in a  reasonable and predictable amount of time  Equipment Recommendations  BSC/3in1;Hospital bed;Wheelchair (measurements OT)       Precautions / Restrictions Precautions Precautions: Fall Restrictions Weight Bearing Restrictions: No      Mobility Bed Mobility Overal bed mobility: Needs Assistance Bed Mobility: Supine to Sit;Sit to Supine     Supine to sit: Total assist;HOB elevated Sit to supine: Total assist            Balance Overall balance assessment: Needs assistance Sitting-balance support: Bilateral upper extremity supported;Feet supported Sitting balance-Leahy Scale: Zero   Postural control: Right lateral lean                                 ADL either performed or assessed with clinical judgement   ADL Overall ADL's : Needs assistance/impaired Eating/Feeding: NPO   Grooming: Wash/dry face;Moderate assistance Grooming Details (indicate cue type and reason): sitting EOB (max A for balance)--washed only 1/2 her face Upper Body Bathing: Total assistance;Bed level   Lower Body Bathing: Total assistance;Bed level   Upper Body Dressing : Total assistance;Bed level   Lower Body Dressing: Total assistance;Bed level                       Vision   Additional Comments: Kept eyes closed 99% of session            Pertinent Vitals/Pain Pain Assessment: PAINAD Breathing: normal Negative Vocalization: none Facial Expression: smiling or inexpressive Body Language: relaxed Consolability: no need to console PAINAD Score: 0  Hand Dominance Right   Extremity/Trunk Assessment Upper Extremity Assessment Upper Extremity Assessment: Generalized weakness           Communication Communication Communication: No difficulties   Cognition Arousal/Alertness: Lethargic Behavior During Therapy: Flat affect;Restless Overall Cognitive Status: Impaired/Different from baseline Area of Impairment: Orientation;Attention;Following  commands;Safety/judgement;Awareness;Problem solving                 Orientation Level: Disoriented to;Person;Place;Time;Situation Current Attention Level: Focused   Following Commands: Follows one step commands inconsistently Safety/Judgement: Decreased awareness of safety;Decreased awareness of deficits Awareness: Intellectual Problem Solving: Slow processing;Decreased initiation;Difficulty sequencing;Requires verbal cues;Requires tactile cues General Comments: Hallucinations, talking about whatever comes to her mind.                Home Living Family/patient expects to be discharged to:: Skilled nursing facility Living Arrangements: Children Available Help at Discharge: Family;Available 24 hours/day               Bathroom Shower/Tub: Tub/shower unit;Curtain         Home Equipment: Grab bars - tub/shower          Prior Functioning/Environment Prior Level of Function : Needs assist       Physical Assist : ADLs (physical)   ADLs (physical): Dressing            OT Problem List: Decreased strength;Impaired balance (sitting and/or standing)      OT Treatment/Interventions: Self-care/ADL training;DME and/or AE instruction;Patient/family education;Balance training    OT Goals(Current goals can be found in the care plan section) Acute Rehab OT Goals Patient Stated Goal: dtr hopeful to take pt home OT Goal Formulation: With family Time For Goal Achievement: 08/17/21 Potential to Achieve Goals: Fair  OT Frequency: Min 2X/week              AM-PAC OT "6 Clicks" Daily Activity     Outcome Measure Help from another person eating meals?: Total Help from another person taking care of personal grooming?: Total Help from another person toileting, which includes using toliet, bedpan, or urinal?: Total Help from another person bathing (including washing, rinsing, drying)?: Total Help from another person to put on and taking off regular upper body  clothing?: Total Help from another person to put on and taking off regular lower body clothing?: Total 6 Click Score: 6   End of Session    Activity Tolerance: Patient limited by lethargy Patient left: in bed;with call bell/phone within reach;with bed alarm set;with family/visitor present  OT Visit Diagnosis: Unsteadiness on feet (R26.81);Other abnormalities of gait and mobility (R26.89);Muscle weakness (generalized) (M62.81);Other symptoms and signs involving cognitive function                Time: 1000-1033 OT Time Calculation (min): 33 min Charges:  OT General Charges $OT Visit: 1 Visit OT Evaluation $OT Eval Moderate Complexity: 1 Mod OT Treatments $Self Care/Home Management : 8-22 mins  [address] [address], OTR/L Acute [employer] Pager [phone] Office [phone]    Almon Register 08/03/2021, 12:01 PM

## 2021-08-03 NOTE — Progress Notes (Signed)
PROGRESS NOTE    Sophia Hancock  LKT:[id number] DOB: 1928/03/11 DOA: 08/02/2021 PCP: Curly Rim, MD   Brief Narrative:  This 87 years old female with PMH significant for dementia, hypertension, hard of hearing, arthritis, history of SVT brought in by EMS from home with altered mental status.  Patient was found under the bed by her family not sure how long she was under the bed.  Assuming she might have fallen from bed and remained there throughout the night.  Patient has acquired COVID infection 2 days ago from [place] her symptoms are very mild daughter reports having runny nose and slight congestion but denies any cough or shortness of breath. Patient is admitted for acute confusion could be secondary to UTI and COVID infection.  Assessment & Plan:   Principal Problem:   Altered mental status Active Problems:   Hypertension   SVT (supraventricular tachycardia) (HCC)   Hearing loss   Urinary incontinence   Osteoporosis   Chronic kidney disease (CKD), stage III (moderate) (HCC)  Altered mental status could be multifactorial: Suspect multifactorial, could be UTI, COVID+, Dehydration , decreased PO Patient was found on the floor, unsure how long she was on floor. CK 395, not very high.  UA consistent with UTI. Continue IV hydration, Continue IV ceftriaxone. Follow-up urine culture. CT head unremarkable, CT C-spine unremarkable. Neurochecks every 4-6 hours. Patient has slightly improved. She is following partial commands.   UTI: UA consistent with UTI, Continue Ceftriaxone, follow-up urine culture. Patient does not meet sepsis criteria. Lactic acid normal.   COVID+: Patient acquired COVID infection from [facility].   Patient not having any significant respiratory symptoms. Continue airborne and droplet precautions. Continue supportive care   Fall: Suspect she might have fallen from bed and remained on the floor throughout night. Skeletal work-up  completely unremarkable. CK slightly elevated continue gentle IV hydration. PT and OT recommended skilled nursing facility.   Essential hypertension: Hold hydrochlorothiazide, Start hydralazine as needed.   DVT prophylaxis:Lovenox Code Status: DNR Family Communication:Daughter in the room Disposition Plan:   Status is: Observation  The patient remains OBS appropriate and will d/c before 2 midnights.  Altered mental status could be secondary to UTI and COVID infection.   Requiring IV antibiotics.  Patient needs SNF placement, severely deconditioned.   Consultants:  None  Procedures: CT head and C-spine Antimicrobials:  Anti-infectives (From admission, onward)    Start     Dose/Rate Route Frequency Ordered Stop   08/03/21 1400  cefTRIAXone (ROCEPHIN) 1 g in sodium chloride 0.9 % 100 mL IVPB        1 g 200 mL/hr over 30 Minutes Intravenous Every 24 hours 08/02/21 1607     08/02/21 1315  cefTRIAXone (ROCEPHIN) 1 g in sodium chloride 0.9 % 100 mL IVPB        1 g 200 mL/hr over 30 Minutes Intravenous  Once 08/02/21 1303 08/02/21 1348       Subjective: Patient was seen and examined at bedside.  Overnight events noted.   Patient appears slightly improved.  She is following partial commands.   She was restless and agitated and was given Ativan last night.  Objective: Vitals:   08/02/21 2118 08/03/21 0046 08/03/21 0351 08/03/21 0745  BP:  (!) 159/73 (!) 123/99 (!) 152/96  Pulse:  79 92 84  Resp:  18 18 20   Temp:  97.7 F (36.5 C) 98.2 F (36.8 C) 98.3 F (36.8 C)  TempSrc:  Oral Oral Oral  SpO2:  100% 99% 98%  Weight: 57.4 kg     Height: 5\' 4"  (1.626 m)       Intake/Output Summary (Last 24 hours) at 08/03/2021 1244 Last data filed at 08/03/2021 0811 Gross per 24 hour  Intake 372.42 ml  Output 500 ml  Net -127.58 ml   Filed Weights   08/02/21 1143 08/02/21 2118  Weight: 61.2 kg 57.4 kg    Examination:  General exam: Appears comfortable, deconditioned, not  in any distress. Respiratory system: Clear to auscultation. Respiratory effort normal.  RR 15 Cardiovascular system: S1-S2 heard, regular rate and rhythm, no murmur. Gastrointestinal system: Abdomen is soft, nontender, nondistended, BS+ Central nervous system: Alert and oriented x 1. No focal neurological deficits. Extremities: No edema, no cyanosis, no clubbing Skin: No rashes, lesions or ulcers Psychiatry:  Mood & affect appropriate.     Data Reviewed: I have personally reviewed following labs and imaging studies  CBC: Recent Labs  Lab 08/02/21 1217 08/02/21 1719 08/03/21 0421  WBC 7.1 6.5 5.7  NEUTROABS 5.5  --   --   HGB 13.4 12.6 13.7  HCT 40.1 37.6 40.9  MCV 97.8 97.2 96.7  PLT 170 169 062   Basic Metabolic Panel: Recent Labs  Lab 08/02/21 1217 08/02/21 1719 08/03/21 0421  NA 135  --  135  K 3.9  --  3.5  CL 103  --  103  CO2 27  --  22  GLUCOSE 96  --  95  BUN 24*  --  14  CREATININE 0.86 0.66 0.62  CALCIUM 8.4*  --  8.2*  MG  --   --  1.9  PHOS  --   --  2.4*   GFR: Estimated Creatinine Clearance: 39.6 mL/min (by C-G formula based on SCr of 0.62 mg/dL). Liver Function Tests: Recent Labs  Lab 08/02/21 1217 08/03/21 0421  AST 31 49*  ALT 16 21  ALKPHOS 73 68  BILITOT 0.8 0.9  PROT 6.1* 5.9*  ALBUMIN 3.6 3.3*   No results for input(s): LIPASE, AMYLASE in the last 168 hours. No results for input(s): AMMONIA in the last 168 hours. Coagulation Profile: No results for input(s): INR, PROTIME in the last 168 hours. Cardiac Enzymes: Recent Labs  Lab 08/02/21 1217  CKTOTAL 390*   BNP (last 3 results) No results for input(s): PROBNP in the last 8760 hours. HbA1C: No results for input(s): HGBA1C in the last 72 hours. CBG: No results for input(s): GLUCAP in the last 168 hours. Lipid Profile: No results for input(s): CHOL, HDL, LDLCALC, TRIG, CHOLHDL, LDLDIRECT in the last 72 hours. Thyroid Function Tests: No results for input(s): TSH, T4TOTAL,  FREET4, T3FREE, THYROIDAB in the last 72 hours. Anemia Panel: No results for input(s): VITAMINB12, FOLATE, FERRITIN, TIBC, IRON, RETICCTPCT in the last 72 hours. Sepsis Labs: Recent Labs  Lab 08/02/21 1719  LATICACIDVEN 1.0    Recent Results (from the past 240 hour(s))  Resp Panel by RT-PCR (Flu A&B, Covid) Nasopharyngeal Swab     Status: Abnormal   Collection Time: 08/02/21  5:19 PM   Specimen: Nasopharyngeal Swab; Nasopharyngeal(NP) swabs in vial transport medium  Result Value Ref Range Status   SARS Coronavirus 2 by RT PCR POSITIVE (A) NEGATIVE Final    Comment: CRITICAL RESULT CALLED TO, READ BACK BY AND VERIFIED WITH:  [place] CUMMINGS RN 08/03/21 @ 0315 VS (NOTE) SARS-CoV-2 target nucleic acids are DETECTED.  The SARS-CoV-2 RNA is generally detectable in upper respiratory specimens during the acute phase  of infection. Positive results are indicative of the presence of the identified virus, but do not rule out bacterial infection or co-infection with other pathogens not detected by the test. Clinical correlation with patient history and other diagnostic information is necessary to determine patient infection status. The expected result is Negative.  Fact Sheet for Patients: EntrepreneurPulse.com.au  Fact Sheet for Healthcare Providers: IncredibleEmployment.be  This test is not yet approved or cleared by the [place] FDA and  has been authorized for detection and/or diagnosis of SARS-CoV-2 by FDA under an Emergency Use Authorization (EUA).  This EUA will remain in effect (meaning this  test can be used) for the duration of  the COVID-19 declaration under Section 564(b)(1) of the Act, 21 U.S.C. section 360bbb-3(b)(1), unless the authorization is terminated or revoked sooner.     Influenza A by PCR NEGATIVE NEGATIVE Final   Influenza B by PCR NEGATIVE NEGATIVE Final    Comment: (NOTE) The Xpert Xpress SARS-CoV-2/FLU/RSV plus assay  is intended as an aid in the diagnosis of influenza from Nasopharyngeal swab specimens and should not be used as a sole basis for treatment. Nasal washings and aspirates are unacceptable for Xpert Xpress SARS-CoV-2/FLU/RSV testing.  Fact Sheet for Patients: EntrepreneurPulse.com.au  Fact Sheet for Healthcare Providers: IncredibleEmployment.be  This test is not yet approved or cleared by the [place] FDA and has been authorized for detection and/or diagnosis of SARS-CoV-2 by FDA under an Emergency Use Authorization (EUA). This EUA will remain in effect (meaning this test can be used) for the duration of the COVID-19 declaration under Section 564(b)(1) of the Act, 21 U.S.C. section 360bbb-3(b)(1), unless the authorization is terminated or revoked.  Performed at [facility], [place] [address], [place], [place] [address]     Radiology Studies: DG Chest 1 View  Result Date: 08/02/2021 CLINICAL DATA:  Fall. EXAM: CHEST  1 VIEW COMPARISON:  August 04, 2009. FINDINGS: Stable cardiomediastinal silhouette. Both lungs are clear. The visualized skeletal structures are unremarkable. IMPRESSION: No active disease. Electronically Signed   By: Marijo Conception M.D.   On: 08/02/2021 12:35   DG Elbow Complete Right  Result Date: 08/02/2021 CLINICAL DATA:  Fall EXAM: RIGHT ELBOW - COMPLETE 3+ VIEW COMPARISON:  None. FINDINGS: There is no evidence of fracture, dislocation, or joint effusion. There is no evidence of arthropathy or other focal bone abnormality. Soft tissues are unremarkable. IMPRESSION: Negative. Electronically Signed   By: Franchot Gallo M.D.   On: 08/02/2021 11:56   CT Head Wo Contrast  Result Date: 08/02/2021 CLINICAL DATA:  Unwitnessed fall, injury EXAM: CT HEAD WITHOUT CONTRAST TECHNIQUE: Contiguous axial images were obtained from the base of the skull through the vertex without intravenous contrast. COMPARISON:  01/23/2019  FINDINGS: Brain: Limited with motion artifact. Similar age-related atrophy and chronic white matter microvascular ischemic changes throughout both cerebral hemispheres. No acute intracranial hemorrhage, definite new mass lesion, new acute infarction, midline shift, herniation, hydrocephalus, or extra-axial fluid collection. No focal mass effect or edema. Cisterns are patent. Cerebellar atrophy as well. Vascular: Intracranial atherosclerosis.  No hyperdense vessel Skull: Normal. Negative for fracture or focal lesion. Sinuses/Orbits: No acute finding. Other: None. IMPRESSION: Limited with motion artifact. Stable atrophy and white matter microvascular changes. No significant interval change or acute process by noncontrast CT. Electronically Signed   By: Jerilynn Mages.  Shick M.D.   On: 08/02/2021 13:21   CT Cervical Spine Wo Contrast  Result Date: 08/02/2021 CLINICAL DATA:  Fall from bed EXAM: CT CERVICAL SPINE  WITHOUT CONTRAST TECHNIQUE: Multidetector CT imaging of the cervical spine was performed without intravenous contrast. Multiplanar CT image reconstructions were also generated. COMPARISON:  Thyroid ultrasound, 04/05/2021 FINDINGS: Examination is very limited by pervasive motion artifact throughout, including on technical repeat acquisition. Alignment: Normal. Skull base and vertebrae: No acute fracture. No primary bone lesion or focal pathologic process. Soft tissues and spinal canal: No prevertebral fluid or swelling. No visible canal hematoma. Disc levels: Mild multilevel disc space height loss and osteophytosis Upper chest: Negative. Other: Enlarged left lobe of the thyroid, which deflects the trachea rightward, previously imaged by dedicated thyroid ultrasound. This has been evaluated on previous imaging. (ref: J Am Coll Radiol. 2015 Feb;12(2): 143-50). IMPRESSION: 1. Examination is very limited by pervasive motion artifact throughout, including on technical repeat acquisition. Within this limitation, no obvious  fracture or static subluxation of the cervical spine. 2. Mild multilevel cervical disc degenerative disease. Electronically Signed   By: Delanna Ahmadi M.D.   On: 08/02/2021 13:25   DG Hips Bilat W or Wo Pelvis 3-4 Views  Result Date: 08/02/2021 CLINICAL DATA:  Fall EXAM: DG HIP (WITH OR WITHOUT PELVIS) 3-4V BILAT COMPARISON:  04/22/2013 FINDINGS: Negative for hip fracture.  Advanced osteoarthritis right hip. Negative for pelvic fracture. IMPRESSION: Negative for fracture.  Advanced degenerative change right hip Electronically Signed   By: Franchot Gallo M.D.   On: 08/02/2021 11:56    Scheduled Meds:  docusate sodium  100 mg Oral BID   enoxaparin (LOVENOX) injection  40 mg Subcutaneous Q24H   Continuous Infusions:  sodium chloride     cefTRIAXone (ROCEPHIN)  IV       LOS: 0 days    Time spent: 25 mins    Shawna Clamp, MD [facility]   If 7PM-7AM, please contact night-coverage

## 2021-08-03 NOTE — Evaluation (Signed)
Physical Therapy Evaluation Patient Details Name: Sophia Hancock MRN: [id number] DOB: Apr 28, 1928 Today's Date: 08/03/2021  History of Present Illness  87yo female who presented on 08/02/21 with AMS; she fell OOB and was found under the bed by family, who also reports she has been hallucinating recently. Imaging negative for fracture, but Covid +. Admitted with UTI, covid, and hx of fall at home. PMH dementia, HTN, HOH, SVT  Clinical Impression   Received in bed, still lethargic and kept eyes closed during much of session, but more alert and able to participate a bit more this afternoon. Perseverated on going to the bathroom, restless and kept pulling at [place]. Able to get to EOB and ultimately transfer to [facility] with Max-TotalA from PT; son present and assisted therapist during session as well. Able to have BM and urinate when on BSC. Fatigues quite quickly, also tends to lean to her right very strongly after standing more than a few seconds at a time. Balance sitting EOB and on BSC was better than earlier today- able to maintain midline with Min guard-MinA.  Did OK with face to face transfers today with second person "spotting" the transfer, but really needs second skilled person for safety to progress mobility. Left positioned to comfort in bed with all needs met, bed alarm active and family present.   Will benefit from f/u in SNF setting moving forward.      Recommendations for follow up therapy are one component of a multi-disciplinary discharge planning process, led by the attending physician.  Recommendations may be updated based on patient status, additional functional criteria and insurance authorization.  Follow Up Recommendations Skilled nursing-short term rehab (<3 hours/day)    Assistance Recommended at Discharge Frequent or constant Supervision/Assistance  Functional Status Assessment Patient has had a recent decline in their functional status and demonstrates the ability to make  significant improvements in function in a reasonable and predictable amount of time.  Equipment Recommendations  Rolling walker (2 wheels);BSC/3in1;Wheelchair (measurements PT);Wheelchair cushion (measurements PT)    Recommendations for Other Services       Precautions / Restrictions Precautions Precautions: Fall;Other (comment) Precaution Comments: Covid +, HOH Restrictions Weight Bearing Restrictions: No      Mobility  Bed Mobility Overal bed mobility: Needs Assistance Bed Mobility: Supine to Sit;Sit to Supine     Supine to sit: HOB elevated;Total assist Sit to supine: Total assist   General bed mobility comments: eyes closed and needed cues to open them; once I had her in midiline sitting at EOB she was able to maintian midline with Min guard-MinA    Transfers Overall transfer level: Needs assistance   Transfers: Sit to/from Stand;Bed to chair/wheelchair/BSC Sit to Stand: From elevated surface;Mod assist;+2 physical assistance Stand pivot transfers: Max assist         General transfer comment: able to stand with ModAx2 (son present and assisted therapist) or TotalAx1; quickly starts leaning to the right when standing more than a few seconds at a time. Needed heavy MaxA x1 and spotting of second person for stand pivot transfers with face to face technique, did move her feet to help pivot once PT initiated motion     Ambulation/Gait               General Gait Details: deferred- safety  Stairs            Wheelchair Mobility    Modified Rankin (Stroke Patients Only)       Balance Overall balance assessment: Needs assistance  Sitting-balance support: Bilateral upper extremity supported;Feet supported Sitting balance-Leahy Scale: Poor Sitting balance - Comments: min guard-MinA but does tend to lean if not cued Postural control: Right lateral lean Standing balance support: No upper extremity supported;During functional activity Standing balance-Leahy  Scale: Zero Standing balance comment: reliant on external support for balance                             Pertinent Vitals/Pain Pain Assessment: Faces Faces Pain Scale: Hurts a little bit Pain Location: generalized discomfort Pain Descriptors / Indicators: Discomfort Pain Intervention(s): Limited activity within patient's tolerance;Monitored during session    Home Living Family/patient expects to be discharged to:: Skilled nursing facility Living Arrangements: Children Available Help at Discharge: Family;Available 24 hours/day Type of Home: House Home Access: Stairs to enter   [employer] of Steps: 3STE in garage with U rail, 3STE in front with B rails , 3STE in back with B rails   Home Layout: One level Home Equipment: Grab bars - tub/shower      Prior Function Prior Level of Function : Needs assist       Physical Assist : ADLs (physical)   ADLs (physical): Dressing Mobility Comments: per family, moves well at baseline, able to take herself to the bathroom, etc       Hand Dominance   Dominant Hand: Right    Extremity/Trunk Assessment   Upper Extremity Assessment Upper Extremity Assessment: Defer to OT evaluation    Lower Extremity Assessment Lower Extremity Assessment: Generalized weakness    Cervical / Trunk Assessment Cervical / Trunk Assessment: Kyphotic  Communication   Communication: No difficulties  Cognition Arousal/Alertness: Lethargic Behavior During Therapy: Flat affect;Restless Overall Cognitive Status: Impaired/Different from baseline Area of Impairment: Orientation;Attention;Following commands;Safety/judgement;Awareness;Problem solving                 Orientation Level: Disoriented to;Place;Time;Situation Current Attention Level: Focused   Following Commands: Follows one step commands inconsistently Safety/Judgement: Decreased awareness of safety;Decreased awareness of deficits Awareness: Intellectual Problem  Solving: Slow processing;Decreased initiation;Difficulty sequencing;Requires verbal cues;Requires tactile cues General Comments: restless, perseverating on needing to go to the bathroom and trying to grab at [facility]; much calmer and fell asleep quickly after returning to supine. Needed cues to keep eyes open, often closed them while sittign on toilet. Asks me if her parents are alive, also tells me she needs help going to the bathroom because she doesn't know where she is and is scared she will get lost        General Comments      Exercises     Assessment/Plan    PT Assessment Patient needs continued PT services  PT Problem List Decreased strength;Decreased cognition;Decreased knowledge of use of DME;Decreased activity tolerance;Decreased safety awareness;Decreased balance;Decreased mobility;Decreased coordination       PT Treatment Interventions DME instruction;Balance training;Gait training;Stair training;Cognitive remediation;Functional mobility training;Patient/family education;Therapeutic activities;Therapeutic exercise;Wheelchair mobility training;Neuromuscular re-education    PT Goals (Current goals can be found in the Care Plan section)  Acute Rehab PT Goals Patient Stated Goal: SNF for rehab PT Goal Formulation: With family Time For Goal Achievement: 08/17/21 Potential to Achieve Goals: Good    Frequency Min 2X/week   Barriers to discharge        Co-evaluation               AM-PAC PT "6 Clicks" Mobility  Outcome Measure Help needed turning from your back to your side while in a flat  bed without using bedrails?: A Lot Help needed moving from lying on your back to sitting on the side of a flat bed without using bedrails?: Total Help needed moving to and from a bed to a chair (including a wheelchair)?: Total Help needed standing up from a chair using your arms (e.g., wheelchair or bedside chair)?: Total Help needed to walk in hospital room?: Total Help needed  climbing 3-5 steps with a railing? : Total 6 Click Score: 7    End of Session   Activity Tolerance: Patient tolerated treatment well Patient left: in bed;with call bell/phone within reach;with bed alarm set;with family/visitor present Nurse Communication: Mobility status PT Visit Diagnosis: Unsteadiness on feet (R26.81);Muscle weakness (generalized) (M62.81);Difficulty in walking, not elsewhere classified (R26.2);History of falling (Z91.81)    Time: 8004-4715 PT Time Calculation (min) (ACUTE ONLY): 39 min   Charges:   PT Evaluation $PT Eval Moderate Complexity: 1 Mod PT Treatments $Therapeutic Activity: 23-37 mins       Windell Norfolk, DPT, PN2   Supplemental Physical Therapist [place]    Pager [phone] Acute Rehab Office [phone]

## 2021-08-03 NOTE — Progress Notes (Signed)
Pt becoming restless and more agitated, pulling at IV lines and picking in air. [place] placed on pt. Family at bedside. SRP, RN

## 2021-08-04 DIAGNOSIS — Z7189 Other specified counseling: Secondary | ICD-10-CM

## 2021-08-04 DIAGNOSIS — N183 Chronic kidney disease, stage 3 unspecified: Secondary | ICD-10-CM

## 2021-08-04 DIAGNOSIS — N3000 Acute cystitis without hematuria: Secondary | ICD-10-CM

## 2021-08-04 DIAGNOSIS — H9193 Unspecified hearing loss, bilateral: Secondary | ICD-10-CM

## 2021-08-04 MED ORDER — QUETIAPINE FUMARATE 25 MG PO TABS
25.0000 mg | ORAL_TABLET | Freq: Every day | ORAL | Status: DC
  Administered 2021-08-04 – 2021-08-08 (×5): 25 mg via ORAL
  Filled 2021-08-04 (×5): qty 1

## 2021-08-04 NOTE — Consult Note (Signed)
Palliative Medicine Inpatient Consult Note  Reason for consult:  GOC  HPI: Sophia Hancock is a 87 year old female with a PMH of dementia, hypertension, arthritis, and history of SVT. She is hard of hearing. She was brought in by EMS following a fall with unknown down time. She was found by family under the bed and was confused with hallucinations. She had tested positive for COVID 2 days prior with mild symptoms of runny nose and light congestions. Confusion could be secondary to COVID,  UTI, dehydration decreased PO intake.  Clinical Assessment/Goals of Care: I have reviewed medical records including EPIC notes, labs and imaging, received report from bedside RN, assessed the patient.    I met with daughter Sophia Hancock Sophia Hancock) Sophia Hancock vis phone to further discuss diagnosis prognosis, Reading, EOL wishes, disposition and options. Sophia Hancock's husband, Sophia Hancock was also present via speaker phone.     I introduced Palliative Medicine as specialized medical care for people living with serious illness. It focuses on providing relief from the symptoms and stress of a serious illness. The goal is to improve quality of life for both the patient and the family.  Sophia Hancock is a widow. She held both a seminary degree and a Masters degree in social work. Her husband was a Theme park manager and she played the piano at church. She has lived with their daughter for the past 12 years. She attends [employer] [facility].  She has managed well in the daughter's home. Sophia Hancock reports her mom like animals and children She loves sweet things like ice cream and cookies. She talks a lot about her family including her deceased parents Sophia Hancock and Sophia Hancock, A neighbor Sophia Hancock, and Sister Sophia Hancock. Concerns are incontinence and being abandoned.  If she gets anxious stroking her forehead helps.   A detailed discussion was had today regarding advanced directives.  Concepts specific to code status, artifical feeding and hydration, continued IV antibiotics and  rehospitalization was had.  The difference between a aggressive medical intervention path  and a palliative comfort care path for this patient at this time was had. Values and goals of care important to patient and family were attempted to be elicited.  Sophia Hancock directed me on where to find Sophia Hancock advance directive paperwork in the room. I was able to find it and scan it into [employer]. First person to make decisions is her daughter, Sophia Hancock. Second is her son-in-law and third is the grandson, Sophia Hancock Emergency planning/management officer).    We discussed the importance of continued conversation with family and their  medical providers regarding overall plan of care and treatment options, ensuring decisions are within the context of the patients values and GOCs.  Decision Maker: Daughter, Sophia Hancock [phone]  SUMMARY OF RECOMMENDATIONS    Code Status/Advance Care Planning:  DNAR/DNI  Symptom Management:  Dementia- delirium precautions, Seroquel at bedtime. Pain- ibuprofen prn Nausea- ondansetron  Palliative Prophylaxis:  Constipation- docusate sodium   Psycho-social/Spiritual:  Desire for further Chaplaincy support: Yes, [employer], consult placed. Additional Recommendations: PT and OT have recommended skilled nursing facility for rehab.  Discharge Planning:  PT and OT suggest SNF for rehab. Daughter would like to transition to home or to home after rehab when appropriate. She is hopeful that the next day or two will see continued clearing of symptoms.     Vitals with BMI 08/04/2021 08/03/2021 08/03/2021  Height - - -  Weight - - -  BMI - - -  Systolic [phone]  Diastolic 90 78  86  Pulse 91 96 84      PPS: 40%    Thank you for the opportunity to participate in the care of this patient and family.   Total Time: 70 minutes Greater than 50%  of this time was spent counseling and coordinating care related to the above assessment and plan.  Lindell Spar, NP [facility] Palliative Medicine  Team Team Cell Phone: [phone] Please utilize secure chat with additional questions, if there is no response within 30 minutes please call the above phone number  Palliative Medicine Team providers are available by phone from 7am to 7pm daily and can be reached through the team cell phone.  Should this patient require assistance outside of these hours, please call the patient's attending physician.

## 2021-08-04 NOTE — Progress Notes (Signed)
PROGRESS NOTE    Sophia Hancock  VZC:[id number] DOB: 01/30/1928 DOA: 08/02/2021 PCP: Curly Rim, MD   Brief Narrative:  This 87 years old female with PMH significant for dementia, hypertension, hard of hearing, arthritis, history of SVT brought in by EMS from home with altered mental status.  Patient was found under the bed by her family not sure how long she was under the bed.  Assuming she might have fallen from bed and remained there throughout the night.  Patient has acquired COVID infection 2 days ago from [place] her symptoms are very mild daughter reports having runny nose and slight congestion but denies any cough or shortness of breath. Patient is admitted for acute confusion could be secondary to UTI and COVID infection.  Assessment & Plan:   Principal Problem:   Altered mental status Active Problems:   Hypertension   SVT (supraventricular tachycardia) (HCC)   Hearing loss   Urinary incontinence   Osteoporosis   Chronic kidney disease (CKD), stage III (moderate) (HCC)  Altered mental status could be multifactorial: Suspect multifactorial, could be UTI, COVID+, Dehydration , decreased PO Patient was found on the floor, unsure how long she was on floor. CK 395, not very high.  UA consistent with UTI. Continue IV hydration, Continue IV ceftriaxone. Urine culture contaminated. CT head unremarkable, CT C-spine unremarkable. Neurochecks every 4-6 hours. Patient has slightly improved. She is following partial commands.   UTI: UA consistent with UTI, Continue Ceftriaxone, urine culture contaminated/ Patient does not meet sepsis criteria. Lactic acid normal.   COVID+: Patient acquired COVID infection from [place] [facility].   Patient not having any significant respiratory symptoms. Continue airborne and droplet precautions. Continue supportive care   Fall: Suspect she might have fallen from bed and remained on the floor throughout night. Skeletal  work-up completely unremarkable. CK slightly elevated continue gentle IV hydration. PT and OT recommended skilled nursing facility.   Essential hypertension: Hold hydrochlorothiazide, Start hydralazine as needed.  Delirium in the setting of dementia: Continue delirium precautions. Start Seroquel 25 mg at bedtime   DVT prophylaxis:Lovenox Code Status: DNR Family Communication: Daughter in the room Disposition Plan:     Status is: Inpatient  Remains inpatient appropriate because: inpatient  Altered mental status could be secondary to UTI and COVID infection.   Requiring IV antibiotics.  Patient needs SNF placement, severely deconditioned.   Consultants:  None  Procedures: CT head and C-spine.  Antimicrobials:  Anti-infectives (From admission, onward)    Start     Dose/Rate Route Frequency Ordered Stop   08/03/21 1400  cefTRIAXone (ROCEPHIN) 1 g in sodium chloride 0.9 % 100 mL IVPB        1 g 200 mL/hr over 30 Minutes Intravenous Every 24 hours 08/02/21 1607     08/02/21 1315  cefTRIAXone (ROCEPHIN) 1 g in sodium chloride 0.9 % 100 mL IVPB        1 g 200 mL/hr over 30 Minutes Intravenous  Once 08/02/21 1303 08/02/21 1348       Subjective: Patient was seen and examined at bedside.  Overnight events noted.   She was restless and agitated last night and was given Seroquel . Patient seems slightly improved, following partial commands but is still remains confused.  Objective: Vitals:   08/03/21 1316 08/03/21 2206 08/03/21 2210 08/04/21 0343  BP: (!) 152/86 113/78  135/90  Pulse: 84 96  91  Resp: 16 (!) 27 20 20   Temp: 98 F (36.7 C) 98.5 F (  36.9 C)  98.5 F (36.9 C)  TempSrc: Oral Oral  Oral  SpO2: 97% 96%  97%  Weight:      Height:        Intake/Output Summary (Last 24 hours) at 08/04/2021 1245 Last data filed at 08/04/2021 0600 Gross per 24 hour  Intake 677.52 ml  Output 1100 ml  Net -422.48 ml   Filed Weights   08/02/21 1143 08/02/21 2118   Weight: 61.2 kg 57.4 kg    Examination:  General exam: Appears comfortable, deconditioned, not in any acute distress. Respiratory system: Clear to auscultation bilaterally. Respiratory effort normal.  RR 15 Cardiovascular system: S1-S2 heard, regular rate and rhythm, no murmur. Gastrointestinal system: Abdomen is soft, nontender, nondistended, BS+ Central nervous system: Alert and oriented x 1. No focal neurological deficits. Extremities: No edema, no cyanosis, no clubbing Skin: No rashes, lesions or ulcers Psychiatry:  Mood & affect appropriate.     Data Reviewed: I have personally reviewed following labs and imaging studies  CBC: Recent Labs  Lab 08/02/21 1217 08/02/21 1719 08/03/21 0421  WBC 7.1 6.5 5.7  NEUTROABS 5.5  --   --   HGB 13.4 12.6 13.7  HCT 40.1 37.6 40.9  MCV 97.8 97.2 96.7  PLT 170 169 741   Basic Metabolic Panel: Recent Labs  Lab 08/02/21 1217 08/02/21 1719 08/03/21 0421  NA 135  --  135  K 3.9  --  3.5  CL 103  --  103  CO2 27  --  22  GLUCOSE 96  --  95  BUN 24*  --  14  CREATININE 0.86 0.66 0.62  CALCIUM 8.4*  --  8.2*  MG  --   --  1.9  PHOS  --   --  2.4*   GFR: Estimated Creatinine Clearance: 39.6 mL/min (by C-G formula based on SCr of 0.62 mg/dL). Liver Function Tests: Recent Labs  Lab 08/02/21 1217 08/03/21 0421  AST 31 49*  ALT 16 21  ALKPHOS 73 68  BILITOT 0.8 0.9  PROT 6.1* 5.9*  ALBUMIN 3.6 3.3*   No results for input(s): LIPASE, AMYLASE in the last 168 hours. No results for input(s): AMMONIA in the last 168 hours. Coagulation Profile: No results for input(s): INR, PROTIME in the last 168 hours. Cardiac Enzymes: Recent Labs  Lab 08/02/21 1217  CKTOTAL 390*   BNP (last 3 results) No results for input(s): PROBNP in the last 8760 hours. HbA1C: No results for input(s): HGBA1C in the last 72 hours. CBG: No results for input(s): GLUCAP in the last 168 hours. Lipid Profile: No results for input(s): CHOL, HDL,  LDLCALC, TRIG, CHOLHDL, LDLDIRECT in the last 72 hours. Thyroid Function Tests: No results for input(s): TSH, T4TOTAL, FREET4, T3FREE, THYROIDAB in the last 72 hours. Anemia Panel: No results for input(s): VITAMINB12, FOLATE, FERRITIN, TIBC, IRON, RETICCTPCT in the last 72 hours. Sepsis Labs: Recent Labs  Lab 08/02/21 1719  LATICACIDVEN 1.0    Recent Results (from the past 240 hour(s))  Urine Culture     Status: Abnormal   Collection Time: 08/02/21 12:16 PM   Specimen: Urine, Clean Catch  Result Value Ref Range Status   Specimen Description   Final    URINE, CLEAN CATCH Performed at [facility], [place] [address], [place], [place] [address]    Special Requests   Final    NONE Performed at [facility], [place] [address], [place], [place] [address]    Culture MULTIPLE SPECIES  PRESENT, SUGGEST RECOLLECTION (A)  Final   Report Status 08/03/2021 FINAL  Final  Resp Panel by RT-PCR (Flu A&B, Covid) Nasopharyngeal Swab     Status: Abnormal   Collection Time: 08/02/21  5:19 PM   Specimen: Nasopharyngeal Swab; Nasopharyngeal(NP) swabs in vial transport medium  Result Value Ref Range Status   SARS Coronavirus 2 by RT PCR POSITIVE (A) NEGATIVE Final    Comment: CRITICAL RESULT CALLED TO, READ BACK BY AND VERIFIED WITH:  [place] CUMMINGS RN 08/03/21 @ 0315 VS (NOTE) SARS-CoV-2 target nucleic acids are DETECTED.  The SARS-CoV-2 RNA is generally detectable in upper respiratory specimens during the acute phase of infection. Positive results are indicative of the presence of the identified virus, but do not rule out bacterial infection or co-infection with other pathogens not detected by the test. Clinical correlation with patient history and other diagnostic information is necessary to determine patient infection status. The expected result is Negative.  Fact Sheet for Patients: EntrepreneurPulse.com.au  Fact Sheet for Healthcare  Providers: IncredibleEmployment.be  This test is not yet approved or cleared by the [place] FDA and  has been authorized for detection and/or diagnosis of SARS-CoV-2 by FDA under an Emergency Use Authorization (EUA).  This EUA will remain in effect (meaning this  test can be used) for the duration of  the COVID-19 declaration under Section 564(b)(1) of the Act, 21 U.S.C. section 360bbb-3(b)(1), unless the authorization is terminated or revoked sooner.     Influenza A by PCR NEGATIVE NEGATIVE Final   Influenza B by PCR NEGATIVE NEGATIVE Final    Comment: (NOTE) The Xpert Xpress SARS-CoV-2/FLU/RSV plus assay is intended as an aid in the diagnosis of influenza from Nasopharyngeal swab specimens and should not be used as a sole basis for treatment. Nasal washings and aspirates are unacceptable for Xpert Xpress SARS-CoV-2/FLU/RSV testing.  Fact Sheet for Patients: EntrepreneurPulse.com.au  Fact Sheet for Healthcare Providers: IncredibleEmployment.be  This test is not yet approved or cleared by the [place] FDA and has been authorized for detection and/or diagnosis of SARS-CoV-2 by FDA under an Emergency Use Authorization (EUA). This EUA will remain in effect (meaning this test can be used) for the duration of the COVID-19 declaration under Section 564(b)(1) of the Act, 21 U.S.C. section 360bbb-3(b)(1), unless the authorization is terminated or revoked.  Performed at [facility], [place] [address], [place], [place] [address]     Radiology Studies: CT Head Wo Contrast  Result Date: 08/02/2021 CLINICAL DATA:  Unwitnessed fall, injury EXAM: CT HEAD WITHOUT CONTRAST TECHNIQUE: Contiguous axial images were obtained from the base of the skull through the vertex without intravenous contrast. COMPARISON:  01/23/2019 FINDINGS: Brain: Limited with motion artifact. Similar age-related atrophy and chronic white  matter microvascular ischemic changes throughout both cerebral hemispheres. No acute intracranial hemorrhage, definite new mass lesion, new acute infarction, midline shift, herniation, hydrocephalus, or extra-axial fluid collection. No focal mass effect or edema. Cisterns are patent. Cerebellar atrophy as well. Vascular: Intracranial atherosclerosis.  No hyperdense vessel Skull: Normal. Negative for fracture or focal lesion. Sinuses/Orbits: No acute finding. Other: None. IMPRESSION: Limited with motion artifact. Stable atrophy and white matter microvascular changes. No significant interval change or acute process by noncontrast CT. Electronically Signed   By: Jerilynn Mages.  Shick M.D.   On: 08/02/2021 13:21   CT Cervical Spine Wo Contrast  Result Date: 08/02/2021 CLINICAL DATA:  Fall from bed EXAM: CT CERVICAL SPINE WITHOUT CONTRAST TECHNIQUE: Multidetector CT imaging of the cervical spine was performed  without intravenous contrast. Multiplanar CT image reconstructions were also generated. COMPARISON:  Thyroid ultrasound, 04/05/2021 FINDINGS: Examination is very limited by pervasive motion artifact throughout, including on technical repeat acquisition. Alignment: Normal. Skull base and vertebrae: No acute fracture. No primary bone lesion or focal pathologic process. Soft tissues and spinal canal: No prevertebral fluid or swelling. No visible canal hematoma. Disc levels: Mild multilevel disc space height loss and osteophytosis Upper chest: Negative. Other: Enlarged left lobe of the thyroid, which deflects the trachea rightward, previously imaged by dedicated thyroid ultrasound. This has been evaluated on previous imaging. (ref: J Am Coll Radiol. 2015 Feb;12(2): 143-50). IMPRESSION: 1. Examination is very limited by pervasive motion artifact throughout, including on technical repeat acquisition. Within this limitation, no obvious fracture or static subluxation of the cervical spine. 2. Mild multilevel cervical disc  degenerative disease. Electronically Signed   By: Delanna Ahmadi M.D.   On: 08/02/2021 13:25    Scheduled Meds:  docusate sodium  100 mg Oral BID   enoxaparin (LOVENOX) injection  40 mg Subcutaneous Q24H   QUEtiapine  25 mg Oral QHS   Continuous Infusions:  cefTRIAXone (ROCEPHIN)  IV 1 g (08/03/21 1419)     LOS: 1 day    Time spent: 25 mins    Derrick Orris, MD [facility]   If 7PM-7AM, please contact night-coverage

## 2021-08-05 NOTE — Progress Notes (Signed)
PROGRESS NOTE    Sophia Hancock  ZOX:[id number] DOB: [birth year] DOA: 08/02/2021 PCP: Curly Rim, MD   Brief Narrative:  This 87 years old female with PMH significant for dementia, hypertension, hard of hearing, arthritis, history of SVT brought in by EMS from home with altered mental status.  Patient was found under the bed by her family not sure how long she was under the bed.  Assuming she might have fallen from bed and remained there throughout the night.  Patient has acquired COVID infection 2 days ago from [place] her symptoms are very mild daughter reports having runny nose and slight congestion but denies any cough or shortness of breath. Patient is admitted for acute confusion could be secondary to UTI and COVID infection.  Assessment & Plan:   Principal Problem:   Altered mental status Active Problems:   Hypertension   SVT (supraventricular tachycardia) (HCC)   Hearing loss   Urinary incontinence   Osteoporosis   Chronic kidney disease (CKD), stage III (moderate) (HCC)  Altered mental status could be multifactorial: Suspect multifactorial, could be UTI, COVID+, Dehydration , decreased PO Patient was found on the floor, unsure how long she was on floor. CK 395, not very high.  UA consistent with UTI. Continue IV hydration, Continue IV ceftriaxone. Urine culture contaminated. CT head unremarkable, CT C-spine unremarkable. Neurochecks every 4-6 hours. Patient has slightly improved. She is following partial commands. It appears she is now getting back to her baseline mental status.   UTI: UA consistent with UTI, Continued Ceftriaxone, urine culture contaminated. Patient does not meet sepsis criteria. Lactic acid normal.  Discontinue ceftriaxone,  received 3 days of ceftriaxone.   COVID+: Patient acquired COVID infection from [employer] [facility].   Patient not having any significant respiratory symptoms. Continue airborne and droplet  precautions. Continue supportive care   Fall: Suspect she might have fallen from bed and remained on the floor throughout night. Skeletal work-up completely unremarkable. CK slightly elevated,  Continue gentle IV hydration. PT and OT recommended skilled nursing facility.   Essential hypertension: Hold hydrochlorothiazide, Continue  hydralazine as needed.  Delirium in the setting of dementia: Continue delirium precautions. Continue  Seroquel 25 mg at bedtime. Patient remains intermittently confused,  trying to get out of bed. Patient needs safety sitter to avoid falls.   DVT prophylaxis:Lovenox Code Status: DNR Family Communication: Daughter in the room Disposition Plan:     Status is: Inpatient  Remains inpatient appropriate because: inpatient  Altered mental status could be secondary to UTI and COVID infection.   Requiring IV antibiotics.  Patient needs SNF placement, severely deconditioned.   Consultants:  None  Procedures: CT head and C-spine.  Antimicrobials:  Anti-infectives (From admission, onward)    Start     Dose/Rate Route Frequency Ordered Stop   08/03/21 1400  cefTRIAXone (ROCEPHIN) 1 g in sodium chloride 0.9 % 100 mL IVPB        1 g 200 mL/hr over 30 Minutes Intravenous Every 24 hours 08/02/21 1607     08/02/21 1315  cefTRIAXone (ROCEPHIN) 1 g in sodium chloride 0.9 % 100 mL IVPB        1 g 200 mL/hr over 30 Minutes Intravenous  Once 08/02/21 1303 08/02/21 1348       Subjective: Patient was seen and examined at bedside.  Overnight events noted.   Patient has intermittent agitation and restlessness, trying to get out of bed. Patient seems slightly improved, following partial commands but  still remains  confused.  Objective: Vitals:   08/04/21 0343 08/04/21 1321 08/04/21 2028 08/05/21 0354  BP: 135/90 (!) 155/99 (!) 152/89 (!) 124/58  Pulse: 91 87 80 69  Resp: 20 16 18 18   Temp: 98.5 F (36.9 C) 98.3 F (36.8 C) 98.2 F (36.8 C) 97.7 F  (36.5 C)  TempSrc: Oral Oral Oral Oral  SpO2: 97% 100% 96% 97%  Weight:      Height:        Intake/Output Summary (Last 24 hours) at 08/05/2021 1427 Last data filed at 08/04/2021 2051 Gross per 24 hour  Intake 100 ml  Output 700 ml  Net -600 ml   Filed Weights   08/02/21 1143 08/02/21 2118  Weight: 61.2 kg 57.4 kg    Examination:  General exam: Appears comfortable, deconditioned, not in any acute distress, but confused. Respiratory system: Clear to auscultation bilaterally. Respiratory effort normal.  RR 15 Cardiovascular system: S1-S2 heard, regular rate and rhythm, no murmur. Gastrointestinal system: Abdomen is soft, nontender, nondistended, BS+ Central nervous system: Alert and oriented x 1. No focal neurological deficits. Extremities: No edema, no cyanosis, no clubbing Skin: No rashes, lesions or ulcers Psychiatry:  Mood & affect appropriate.     Data Reviewed: I have personally reviewed following labs and imaging studies  CBC: Recent Labs  Lab 08/02/21 1217 08/02/21 1719 08/03/21 0421  WBC 7.1 6.5 5.7  NEUTROABS 5.5  --   --   HGB 13.4 12.6 13.7  HCT 40.1 37.6 40.9  MCV 97.8 97.2 96.7  PLT 170 169 833   Basic Metabolic Panel: Recent Labs  Lab 08/02/21 1217 08/02/21 1719 08/03/21 0421  NA 135  --  135  K 3.9  --  3.5  CL 103  --  103  CO2 27  --  22  GLUCOSE 96  --  95  BUN 24*  --  14  CREATININE 0.86 0.66 0.62  CALCIUM 8.4*  --  8.2*  MG  --   --  1.9  PHOS  --   --  2.4*   GFR: Estimated Creatinine Clearance: 39.6 mL/min (by C-G formula based on SCr of 0.62 mg/dL). Liver Function Tests: Recent Labs  Lab 08/02/21 1217 08/03/21 0421  AST 31 49*  ALT 16 21  ALKPHOS 73 68  BILITOT 0.8 0.9  PROT 6.1* 5.9*  ALBUMIN 3.6 3.3*   No results for input(s): LIPASE, AMYLASE in the last 168 hours. No results for input(s): AMMONIA in the last 168 hours. Coagulation Profile: No results for input(s): INR, PROTIME in the last 168 hours. Cardiac  Enzymes: Recent Labs  Lab 08/02/21 1217  CKTOTAL 390*   BNP (last 3 results) No results for input(s): PROBNP in the last 8760 hours. HbA1C: No results for input(s): HGBA1C in the last 72 hours. CBG: No results for input(s): GLUCAP in the last 168 hours. Lipid Profile: No results for input(s): CHOL, HDL, LDLCALC, TRIG, CHOLHDL, LDLDIRECT in the last 72 hours. Thyroid Function Tests: No results for input(s): TSH, T4TOTAL, FREET4, T3FREE, THYROIDAB in the last 72 hours. Anemia Panel: No results for input(s): VITAMINB12, FOLATE, FERRITIN, TIBC, IRON, RETICCTPCT in the last 72 hours. Sepsis Labs: Recent Labs  Lab 08/02/21 1719  LATICACIDVEN 1.0    Recent Results (from the past 240 hour(s))  Urine Culture     Status: Abnormal   Collection Time: 08/02/21 12:16 PM   Specimen: Urine, Clean Catch  Result Value Ref Range Status   Specimen Description   Final  URINE, CLEAN CATCH Performed at [facility], [place] [address], [place], [place] [address]    Special Requests   Final    NONE Performed at [facility], [place] [address] [place], [place] [address]    Culture MULTIPLE SPECIES PRESENT, SUGGEST RECOLLECTION (A)  Final   Report Status 08/03/2021 FINAL  Final  Resp Panel by RT-PCR (Flu A&B, Covid) Nasopharyngeal Swab     Status: Abnormal   Collection Time: 08/02/21  5:19 PM   Specimen: Nasopharyngeal Swab; Nasopharyngeal(NP) swabs in vial transport medium  Result Value Ref Range Status   SARS Coronavirus 2 by RT PCR POSITIVE (A) NEGATIVE Final    Comment: CRITICAL RESULT CALLED TO, READ BACK BY AND VERIFIED WITH:  [place] CUMMINGS RN 08/03/21 @ 0315 VS (NOTE) SARS-CoV-2 target nucleic acids are DETECTED.  The SARS-CoV-2 RNA is generally detectable in upper respiratory specimens during the acute phase of infection. Positive results are indicative of the presence of the identified virus, but do not rule out bacterial infection or co-infection  with other pathogens not detected by the test. Clinical correlation with patient history and other diagnostic information is necessary to determine patient infection status. The expected result is Negative.  Fact Sheet for Patients: EntrepreneurPulse.com.au  Fact Sheet for Healthcare Providers: IncredibleEmployment.be  This test is not yet approved or cleared by the [place] FDA and  has been authorized for detection and/or diagnosis of SARS-CoV-2 by FDA under an Emergency Use Authorization (EUA).  This EUA will remain in effect (meaning this  test can be used) for the duration of  the COVID-19 declaration under Section 564(b)(1) of the Act, 21 U.S.C. section 360bbb-3(b)(1), unless the authorization is terminated or revoked sooner.     Influenza A by PCR NEGATIVE NEGATIVE Final   Influenza B by PCR NEGATIVE NEGATIVE Final    Comment: (NOTE) The Xpert Xpress SARS-CoV-2/FLU/RSV plus assay is intended as an aid in the diagnosis of influenza from Nasopharyngeal swab specimens and should not be used as a sole basis for treatment. Nasal washings and aspirates are unacceptable for Xpert Xpress SARS-CoV-2/FLU/RSV testing.  Fact Sheet for Patients: EntrepreneurPulse.com.au  Fact Sheet for Healthcare Providers: IncredibleEmployment.be  This test is not yet approved or cleared by the [place] FDA and has been authorized for detection and/or diagnosis of SARS-CoV-2 by FDA under an Emergency Use Authorization (EUA). This EUA will remain in effect (meaning this test can be used) for the duration of the COVID-19 declaration under Section 564(b)(1) of the Act, 21 U.S.C. section 360bbb-3(b)(1), unless the authorization is terminated or revoked.  Performed at [facility], [place] [address], [place], [place] [address]     Radiology Studies: No results found.  Scheduled Meds:  docusate  sodium  100 mg Oral BID   enoxaparin (LOVENOX) injection  40 mg Subcutaneous Q24H   QUEtiapine  25 mg Oral QHS   Continuous Infusions:  cefTRIAXone (ROCEPHIN)  IV 1 g (08/05/21 1406)     LOS: 2 days    Time spent: 25 mins    Blaize Nipper, MD [facility]   If 7PM-7AM, please contact night-coverage

## 2021-08-05 NOTE — NC FL2 (Signed)
[place] MEDICAID FL2 LEVEL OF CARE SCREENING TOOL     IDENTIFICATION  Patient Name: Sophia Hancock Birthdate: Jul 19, 1928 Sex: female Admission Date (Current Location): 08/02/2021  [facility] and [place] Number:  Herbalist and Address:  [facility],  [place], [place]      Provider Number: [id number]  Attending Physician Name and Address:  Shawna Clamp, MD  Relative Name and Phone Number:  [name],[name] Daughter   [phone]  [name],[name] Relative   [phone]    Current Level of Care: Hospital Recommended Level of Care: [place] Prior Approval Number:    Date Approved/Denied:   PASRR Number: [id number]  Discharge Plan: SNF    Current Diagnoses: Patient Active Problem List   Diagnosis Date Noted   Altered mental status 08/02/2021   Chronic kidney disease (CKD), stage III (moderate) ([place]) 04/13/2019   Major neurocognitive disorder due to Alzheimer's disease, probable, without behavioral disturbance 12/11/2017   Hypertension, essential, benign 10/26/2017   Memory deficit 10/26/2017   Urge incontinence of urine 10/26/2017   Presbycusis of both ears 10/06/2017   Thyroid nodule 09/26/2017   Urinary incontinence 09/26/2017   Short-term memory loss 09/26/2017   Bilateral impacted cerumen 10/02/2016   Osteoporosis 12/15/2013   Left otitis media 10/08/2013   Osteoarthritis of right hip 04/26/2013   Internal hemorrhoids with bleeding and pain 04/13/2013   Hyperglycemia 02/06/2013   Overweight(278.02) 12/03/2011   Hearing loss 12/03/2011   Cerumen impaction 12/03/2011   Preventative health care 12/03/2011   Hypertension    Cancer (HCC)    Hemorrhoids    SVT (supraventricular tachycardia) (HCC)    Foot pain 03/13/2011    Orientation RESPIRATION BLADDER Height & Weight     Self, Place  Normal Incontinent Weight: 126 lb 8.7 oz (57.4 kg) Height:  5\' 4"  (162.6 cm)  BEHAVIORAL SYMPTOMS/MOOD NEUROLOGICAL BOWEL  NUTRITION STATUS      Continent Diet (Regular)  AMBULATORY STATUS COMMUNICATION OF NEEDS Skin   Limited Assist Verbally Normal                       Personal Care Assistance Level of Assistance  Bathing, Dressing, Feeding Bathing Assistance: Limited assistance Feeding assistance: Independent Dressing Assistance: Limited assistance     Functional Limitations Info  Sight, Hearing, Speech Sight Info: Adequate Hearing Info: Adequate Speech Info: Adequate    SPECIAL CARE FACTORS FREQUENCY  PT (By licensed PT), OT (By licensed OT)     PT Frequency: Minimum 5x a week OT Frequency: Minimum 5x a week            Contractures Contractures Info: Not present    Additional Factors Info  Code Status, Allergies, Psychotropic Code Status Info: DNR Allergies Info: Tylenol (Acetaminophen) Psychotropic Info: QUEtiapine (SEROQUEL) tablet 25 mg         Current Medications (08/05/2021):  This is the current hospital active medication list Current Facility-Administered Medications  Medication Dose Route Frequency Provider Last Rate Last Admin   cefTRIAXone (ROCEPHIN) 1 g in sodium chloride 0.9 % 100 mL IVPB  1 g Intravenous Q24H Shawna Clamp, MD 200 mL/hr at 08/05/21 1406 1 g at 08/05/21 1406   docusate sodium (COLACE) capsule 100 mg  100 mg Oral BID Shawna Clamp, MD   100 mg at 08/05/21 1238   enoxaparin (LOVENOX) injection 40 mg  40 mg Subcutaneous Q24H Shawna Clamp, MD   40 mg at 08/04/21 2129   hydrALAZINE (APRESOLINE) injection 10  mg  10 mg Intravenous Q8H PRN Shawna Clamp, MD       ibuprofen (ADVIL) tablet 400 mg  400 mg Oral Q6H PRN Shawna Clamp, MD       ondansetron [facility]) tablet 4 mg  4 mg Oral Q6H PRN Shawna Clamp, MD       Or   ondansetron [facility]) injection 4 mg  4 mg Intravenous Q6H PRN Shawna Clamp, MD       QUEtiapine (SEROQUEL) tablet 25 mg  25 mg Oral QHS Shawna Clamp, MD   25 mg at 08/04/21 2128     Discharge Medications: Please see discharge  summary for a list of discharge medications.  Relevant Imaging Results:  Relevant Lab Results:   Additional Information SSN [id number]  Ross Ludwig, LCSW

## 2021-08-05 NOTE — TOC Initial Note (Signed)
Transition of Care [facility]) - Initial/Assessment Note    Patient Details  Name: Sophia Hancock MRN: [id number] Date of Birth: 1928-02-14  Transition of Care [employer] [facility]) CM/SW Contact:    Ross Ludwig, LCSW Phone Number: 08/05/2021, 5:29 PM  Clinical Narrative:                  Patient is a 87 year old female who is alert and oriented x2.  Patient currently lives with her daughter where she has been staying for the past 12 years.  Patient goes to [employer] adult [facility] day[facility].  CSW attempted to contact patient's daughter Eugene Garnet, via phone, however a message had to be left.  CSW completed assessment by reviewing patient's chart.  Per palliative note, patient's family is interested in SNF placement.  However since patient is Covid+ as of 12/8 patient will not be able to go to a SNF until 12/19 which is 11 days post diagnosis.  CSW will begin bed search in [facility].  Expected Discharge Plan: Skilled Nursing Facility Barriers to Discharge: Continued Medical Work up   Patient Goals and CMS Choice Patient states their goals for this hospitalization and ongoing recovery are:: To go to SNF for short term rehab, then return back home. CMS Medicare.gov Compare Post Acute Care list provided to:: Patient Represenative (must comment) Choice offered to / list presented to : Adult Children  Expected Discharge Plan and Services Expected Discharge Plan: [place] Acute Care Choice: [place] Living arrangements for the past 2 months: Single Family Home                                      Prior Living Arrangements/Services Living arrangements for the past 2 months: Single Family Home Lives with:: Adult Children Patient language and need for interpreter reviewed:: Yes Do you feel safe going back to the place where you live?: No   Per palliative note daughter would like short term rehab first.  Need for Family Participation in Patient Care:  Yes (Comment) Care giver support system in place?: No (comment)   Criminal Activity/Legal Involvement Pertinent to Current Situation/Hospitalization: No - Comment as needed  Activities of Daily Living Home Assistive Devices/Equipment: Hearing aid, Eyeglasses, Cane (specify quad or straight) (bilateral hearing aides, reading glasses) ADL Screening (condition at time of admission) Patient's cognitive ability adequate to safely complete daily activities?: No Is the patient deaf or have difficulty hearing?: Yes (hearing aides in both ears) Does the patient have difficulty seeing, even when wearing glasses/contacts?: Yes Does the patient have difficulty concentrating, remembering, or making decisions?: Yes (dementia) Patient able to express need for assistance with ADLs?: Yes Does the patient have difficulty dressing or bathing?: Yes Independently performs ADLs?: No Communication: Independent Dressing (OT): Needs assistance Is this a change from baseline?: Pre-admission baseline Grooming: Independent Feeding: Needs assistance Is this a change from baseline?: Change from baseline, expected to last >3 days Bathing: Needs assistance Is this a change from baseline?: Change from baseline, expected to last >3 days Toileting: Needs assistance Is this a change from baseline?: Change from baseline, expected to last >3days In/Out Bed: Needs assistance Is this a change from baseline?: Change from baseline, expected to last >3 days Walks in Home: Needs assistance Is this a change from baseline?: Change from baseline, expected to last >3 days Does the patient have difficulty walking  or climbing stairs?: Yes Weakness of Legs: Both Weakness of Arms/Hands: Both  Permission Sought/Granted Permission sought to share information with : Facility Sport and exercise psychologist, Family Supports Permission granted to share information with : Yes, Release of Information Signed  Share Information with NAME:  [name],[name] Daughter   [phone]  [name],[name] Relative   [phone]  Permission granted to share info w AGENCY: SNF admissions        Emotional Assessment Appearance:: Appears stated age   Affect (typically observed): Accepting, Calm, Appropriate Orientation: : Oriented to Self, Oriented to Place Alcohol / Substance Use: Not Applicable Psych Involvement: No (comment)  Admission diagnosis:  Altered mental status [R41.82] Acute cystitis without hematuria [N30.00] Fall, initial encounter [W19.XXXA] Skin tear of right elbow without complication, initial encounter [S51.011A] Altered mental status, unspecified altered mental status type [R41.82] COVID [U07.1] Patient Active Problem List   Diagnosis Date Noted   Altered mental status 08/02/2021   Chronic kidney disease (CKD), stage III (moderate) ([place]) 04/13/2019   Major neurocognitive disorder due to Alzheimer's disease, probable, without behavioral disturbance 12/11/2017   Hypertension, essential, benign 10/26/2017   Memory deficit 10/26/2017   Urge incontinence of urine 10/26/2017   Presbycusis of both ears 10/06/2017   Thyroid nodule 09/26/2017   Urinary incontinence 09/26/2017   Short-term memory loss 09/26/2017   Bilateral impacted cerumen 10/02/2016   Osteoporosis 12/15/2013   Left otitis media 10/08/2013   Osteoarthritis of right hip 04/26/2013   Internal hemorrhoids with bleeding and pain 04/13/2013   Hyperglycemia 02/06/2013   Overweight(278.02) 12/03/2011   Hearing loss 12/03/2011   Cerumen impaction 12/03/2011   Preventative health care 12/03/2011   Hypertension    Cancer ([place])    Hemorrhoids    SVT (supraventricular tachycardia) ([place])    Foot pain 03/13/2011   PCP:  Curly Rim, MD Pharmacy:   [id number] - [place], [place] - [address] [place] [address] Phone: [phone] Fax: [phone]     Social Determinants of  Health (SDOH) Interventions    Readmission Risk Interventions No flowsheet data found.

## 2021-08-06 NOTE — Progress Notes (Signed)
Safety sitter coming for nightshift as patient has continuously gotten out of bed multiple times, pulling her purewick out, and keeps asking for her mother. Family aware of current condition and they are coming to visit tomorrow.

## 2021-08-06 NOTE — Progress Notes (Signed)
PROGRESS NOTE    Sophia Hancock  LXB:[id number] DOB: 08/17/1928 DOA: 08/02/2021 PCP: Curly Rim, MD   Brief Narrative:  This 87 years old female with PMH significant for dementia, hypertension, hard of hearing, arthritis, history of SVT brought in by EMS from home with altered mental status.  Patient was found under the bed by her family not sure how long she was under the bed.  Assuming she might have fallen from bed and remained there throughout the night.  Patient has acquired COVID infection 2 days ago from [facility] her symptoms are very mild daughter reports having runny nose and slight congestion but denies any cough or shortness of breath. Patient is admitted for acute confusion could be secondary to UTI and COVID infection.  Assessment & Plan:   Principal Problem:   Altered mental status Active Problems:   Hypertension   SVT (supraventricular tachycardia) (HCC)   Hearing loss   Urinary incontinence   Osteoporosis   Chronic kidney disease (CKD), stage III (moderate) (HCC)  Altered mental status could be multifactorial: Suspect multifactorial, could be UTI, COVID+, Dehydration , decreased PO Patient was found on the floor, unsure how long she was on floor. CK 395, not very high.  UA consistent with UTI. Continue IV hydration, completed antibiotics for UTI. Urine culture contaminated. CT head unremarkable, CT C-spine unremarkable. It appears she is now getting back to her baseline mental status.   UTI: UA consistent with UTI, Continued Ceftriaxone, urine culture contaminated. Patient does not meet sepsis criteria. Lactic acid normal.  Discontinue ceftriaxone,  received 3 days of ceftriaxone.   COVID+: Patient acquired COVID infection from [facility].   Patient not having any significant respiratory symptoms. Continue airborne and droplet precautions. Continue supportive care   Fall: Suspect she might have fallen from bed and remained on the  floor throughout night. Skeletal work-up completely unremarkable. CK slightly elevated,  Continue gentle IV hydration. PT and OT recommended skilled nursing facility.   Essential hypertension: Hold hydrochlorothiazide, Continue  hydralazine as needed.  Delirium in the setting of dementia: Continue delirium precautions. Continue  Seroquel 25 mg at bedtime. Patient remains intermittently confused,  trying to get out of bed. Patient needs safety sitter to avoid falls.   DVT prophylaxis:Lovenox Code Status: DNR Family Communication: Daughter in the room Disposition Plan:     Status is: Inpatient  Remains inpatient appropriate because: inpatient  Altered mental status could be secondary to UTI and COVID infection.   Requiring IV antibiotics.  Patient needs SNF placement, severely deconditioned.  She is COVID-positive on 08/03/2021.  She cannot go to nursing home until 08/13/2021   Consultants:  None  Procedures: CT head and C-spine.  Antimicrobials:  Anti-infectives (From admission, onward)    Start     Dose/Rate Route Frequency Ordered Stop   08/03/21 1400  cefTRIAXone (ROCEPHIN) 1 g in sodium chloride 0.9 % 100 mL IVPB  Status:  Discontinued        1 g 200 mL/hr over 30 Minutes Intravenous Every 24 hours 08/02/21 1607 08/06/21 0801   08/02/21 1315  cefTRIAXone (ROCEPHIN) 1 g in sodium chloride 0.9 % 100 mL IVPB        1 g 200 mL/hr over 30 Minutes Intravenous  Once 08/02/21 1303 08/02/21 1348       Subjective: Patient was seen and examined at bedside.  Overnight events noted.   Patient seems much improved.  Alert and oriented x1,  following commands.   Objective: Vitals:  08/05/21 0354 08/05/21 1522 08/05/21 2136 08/06/21 0615  BP: (!) 124/58 123/76 126/77 (!) 152/133  Pulse: 69 72 79 72  Resp: 18 18 18 16   Temp: 97.7 F (36.5 C) 98.7 F (37.1 C) 97.8 F (36.6 C) (!) 97.4 F (36.3 C)  TempSrc: Oral Oral Oral Oral  SpO2: 97% 100% 98% 98%  Weight:       Height:        Intake/Output Summary (Last 24 hours) at 08/06/2021 1237 Last data filed at 08/06/2021 0242 Gross per 24 hour  Intake --  Output 150 ml  Net -150 ml   Filed Weights   08/02/21 1143 08/02/21 2118  Weight: 61.2 kg 57.4 kg    Examination:  General exam: Appears comfortable, not in any acute distress, deconditioned. Respiratory system: Clear to auscultation bilaterally. Respiratory effort normal.  RR 15 Cardiovascular system: S1-S2 heard, regular rate and rhythm, no murmur. Gastrointestinal system: Abdomen is soft, nontender, nondistended, BS+ Central nervous system: Alert and oriented x 1. No focal neurological deficits. Extremities: No edema, no cyanosis, no clubbing Skin: No rashes, lesions or ulcers Psychiatry:  Mood & affect appropriate.     Data Reviewed: I have personally reviewed following labs and imaging studies  CBC: Recent Labs  Lab 08/02/21 1217 08/02/21 1719 08/03/21 0421  WBC 7.1 6.5 5.7  NEUTROABS 5.5  --   --   HGB 13.4 12.6 13.7  HCT 40.1 37.6 40.9  MCV 97.8 97.2 96.7  PLT 170 169 163   Basic Metabolic Panel: Recent Labs  Lab 08/02/21 1217 08/02/21 1719 08/03/21 0421  NA 135  --  135  K 3.9  --  3.5  CL 103  --  103  CO2 27  --  22  GLUCOSE 96  --  95  BUN 24*  --  14  CREATININE 0.86 0.66 0.62  CALCIUM 8.4*  --  8.2*  MG  --   --  1.9  PHOS  --   --  2.4*   GFR: Estimated Creatinine Clearance: 39.6 mL/min (by C-G formula based on SCr of 0.62 mg/dL). Liver Function Tests: Recent Labs  Lab 08/02/21 1217 08/03/21 0421  AST 31 49*  ALT 16 21  ALKPHOS 73 68  BILITOT 0.8 0.9  PROT 6.1* 5.9*  ALBUMIN 3.6 3.3*   No results for input(s): LIPASE, AMYLASE in the last 168 hours. No results for input(s): AMMONIA in the last 168 hours. Coagulation Profile: No results for input(s): INR, PROTIME in the last 168 hours. Cardiac Enzymes: Recent Labs  Lab 08/02/21 1217  CKTOTAL 390*   BNP (last 3 results) No results for  input(s): PROBNP in the last 8760 hours. HbA1C: No results for input(s): HGBA1C in the last 72 hours. CBG: No results for input(s): GLUCAP in the last 168 hours. Lipid Profile: No results for input(s): CHOL, HDL, LDLCALC, TRIG, CHOLHDL, LDLDIRECT in the last 72 hours. Thyroid Function Tests: No results for input(s): TSH, T4TOTAL, FREET4, T3FREE, THYROIDAB in the last 72 hours. Anemia Panel: No results for input(s): VITAMINB12, FOLATE, FERRITIN, TIBC, IRON, RETICCTPCT in the last 72 hours. Sepsis Labs: Recent Labs  Lab 08/02/21 1719  LATICACIDVEN 1.0    Recent Results (from the past 240 hour(s))  Urine Culture     Status: Abnormal   Collection Time: 08/02/21 12:16 PM   Specimen: Urine, Clean Catch  Result Value Ref Range Status   Specimen Description   Final    URINE, CLEAN CATCH Performed at [facility], [place] [address], [place], [place] [address]    Special Requests   Final    NONE Performed at [facility], [place] [address], [place], [place] [address]    Culture MULTIPLE SPECIES PRESENT, SUGGEST RECOLLECTION (A)  Final   Report Status 08/03/2021 FINAL  Final  Resp Panel by RT-PCR (Flu A&B, Covid) Nasopharyngeal Swab     Status: Abnormal   Collection Time: 08/02/21  5:19 PM   Specimen: Nasopharyngeal Swab; Nasopharyngeal(NP) swabs in vial transport medium  Result Value Ref Range Status   SARS Coronavirus 2 by RT PCR POSITIVE (A) NEGATIVE Final    Comment: CRITICAL RESULT CALLED TO, READ BACK BY AND VERIFIED WITH:  [place] CUMMINGS RN 08/03/21 @ 0315 VS (NOTE) SARS-CoV-2 target nucleic acids are DETECTED.  The SARS-CoV-2 RNA is generally detectable in upper respiratory specimens during the acute phase of infection. Positive results are indicative of the presence of the identified virus, but do not rule out bacterial infection or co-infection with other pathogens not detected by the test. Clinical correlation with patient history  and other diagnostic information is necessary to determine patient infection status. The expected result is Negative.  Fact Sheet for Patients: EntrepreneurPulse.com.au  Fact Sheet for Healthcare Providers: IncredibleEmployment.be  This test is not yet approved or cleared by the [place] FDA and  has been authorized for detection and/or diagnosis of SARS-CoV-2 by FDA under an Emergency Use Authorization (EUA).  This EUA will remain in effect (meaning this  test can be used) for the duration of  the COVID-19 declaration under Section 564(b)(1) of the Act, 21 U.S.C. section 360bbb-3(b)(1), unless the authorization is terminated or revoked sooner.     Influenza A by PCR NEGATIVE NEGATIVE Final   Influenza B by PCR NEGATIVE NEGATIVE Final    Comment: (NOTE) The Xpert Xpress SARS-CoV-2/FLU/RSV plus assay is intended as an aid in the diagnosis of influenza from Nasopharyngeal swab specimens and should not be used as a sole basis for treatment. Nasal washings and aspirates are unacceptable for Xpert Xpress SARS-CoV-2/FLU/RSV testing.  Fact Sheet for Patients: EntrepreneurPulse.com.au  Fact Sheet for Healthcare Providers: IncredibleEmployment.be  This test is not yet approved or cleared by the [place] FDA and has been authorized for detection and/or diagnosis of SARS-CoV-2 by FDA under an Emergency Use Authorization (EUA). This EUA will remain in effect (meaning this test can be used) for the duration of the COVID-19 declaration under Section 564(b)(1) of the Act, 21 U.S.C. section 360bbb-3(b)(1), unless the authorization is terminated or revoked.  Performed at [facility], [place] [address] [place], [place] [address]     Radiology Studies: No results found.  Scheduled Meds:  docusate sodium  100 mg Oral BID   enoxaparin (LOVENOX) injection  40 mg Subcutaneous Q24H    QUEtiapine  25 mg Oral QHS   Continuous Infusions:     LOS: 3 days    Time spent: 25 mins    Shawna Clamp, MD [facility]   If 7PM-7AM, please contact night-coverage

## 2021-08-06 NOTE — Plan of Care (Signed)
  Problem: Clinical Measurements: Goal: Ability to maintain clinical measurements within normal limits will improve Outcome: Progressing Goal: Will remain free from infection Outcome: Progressing Goal: Diagnostic test results will improve Outcome: Progressing Goal: Respiratory complications will improve Outcome: Progressing Goal: Cardiovascular complication will be avoided Outcome: Progressing   Problem: Activity: Goal: Risk for activity intolerance will decrease Outcome: Progressing   Problem: Elimination: Goal: Will not experience complications related to bowel motility Outcome: Progressing Goal: Will not experience complications related to urinary retention Outcome: Progressing   Problem: Safety: Goal: Ability to remain free from injury will improve Outcome: Progressing

## 2021-08-07 NOTE — Progress Notes (Signed)
PROGRESS NOTE    Sophia Hancock  PIR:[id number] DOB: 06-Feb-1928 DOA: 08/02/2021 PCP: Curly Rim, MD   Brief Narrative:  This 87 years old female with PMH significant for dementia, hypertension, hard of hearing, arthritis, history of SVT brought in by EMS from home with altered mental status.  Patient was found under the bed by her family not sure how long she was under the bed.  Assuming she might have fallen from bed and remained there throughout the night.  Patient has acquired COVID infection 2 days ago from [facility],  her symptoms are very mild,  daughter reports having runny nose and slight congestion but denies any cough or shortness of breath. Patient is admitted for acute confusion could be secondary to UTI and COVID infection.  She has completed antibiotics for UTI.  PT recommended  skilled nursing facility.  She is medically clear , since she is COVID+, She will need to stay in hospital 10 days before she can be discharged to SNF.  Assessment & Plan:   Principal Problem:   Altered mental status Active Problems:   Hypertension   SVT (supraventricular tachycardia) (HCC)   Hearing loss   Urinary incontinence   Osteoporosis   Chronic kidney disease (CKD), stage III (moderate) (HCC)  Altered mental status could be multifactorial: > Improving  Suspect multifactorial, could be UTI, COVID+, Dehydration , decreased PO Patient was found on the floor, unsure how long she was on floor. CK 395, not very high.  UA consistent with UTI. Continue IV hydration, completed antibiotics for UTI. Urine culture contaminated. CT head unremarkable, CT C-spine unremarkable. It appears she is now getting back to her baseline mental status.   UTI: UA consistent with UTI, Continued Ceftriaxone, urine culture contaminated. Patient does not meet sepsis criteria. Lactic acid normal.  Discontinue ceftriaxone,  received 3 days of ceftriaxone.   COVID+: Patient acquired COVID infection  from [facility].   Patient not having any significant respiratory symptoms. Continue airborne and droplet precautions. Continue supportive care   Fall: Suspect she might have fallen from bed and remained on the floor throughout night. Skeletal work-up completely unremarkable. CK slightly elevated,  Continue gentle IV hydration. PT and OT recommended skilled nursing facility.   Essential hypertension: Hold hydrochlorothiazide, Continue  hydralazine as needed.  Delirium in the setting of dementia: Continue delirium precautions. Continue  Seroquel 25 mg at bedtime. Patient remains intermittently confused,  trying to get out of bed. Patient needs safety sitter to avoid falls in the night. Family stays with patient during day time.   DVT prophylaxis:Lovenox Code Status: DNR Family Communication: Son in law in the room Disposition Plan:     Status is: Inpatient  Remains inpatient appropriate because: inpatient  Altered mental status could be secondary to UTI and COVID infection.    Patient is medically clear for discharge: Yes  Patient needs SNF placement, severely deconditioned.  She is COVID-positive on 08/03/2021.  She can be accepted in  nursing home on 08/13/2021.   Consultants:  None  Procedures: CT head and C-spine.  Antimicrobials:  Anti-infectives (From admission, onward)    Start     Dose/Rate Route Frequency Ordered Stop   08/03/21 1400  cefTRIAXone (ROCEPHIN) 1 g in sodium chloride 0.9 % 100 mL IVPB  Status:  Discontinued        1 g 200 mL/hr over 30 Minutes Intravenous Every 24 hours 08/02/21 1607 08/06/21 0801   08/02/21 1315  cefTRIAXone (ROCEPHIN) 1 g in  sodium chloride 0.9 % 100 mL IVPB        1 g 200 mL/hr over 30 Minutes Intravenous  Once 08/02/21 1303 08/02/21 1348       Subjective: Patient was seen and examined at bedside.  Overnight events noted.   Patient seems much improved.  She is alert and oriented x 1,  following  commands. Patient does have intermittent confusion which could be her baseline.   Objective: Vitals:   08/06/21 0615 08/06/21 1407 08/06/21 1949 08/07/21 0500  BP: (!) 152/133 (!) 154/81 (!) 157/83 (!) 117/95  Pulse: 72 77 76 92  Resp: 16  18 20   Temp: (!) 97.4 F (36.3 C) 98.2 F (36.8 C) 98.4 F (36.9 C) 98 F (36.7 C)  TempSrc: Oral Oral Oral   SpO2: 98% 100%  100%  Weight:      Height:        Intake/Output Summary (Last 24 hours) at 08/07/2021 1130 Last data filed at 08/07/2021 1035 Gross per 24 hour  Intake 125 ml  Output 300 ml  Net -175 ml   Filed Weights   08/02/21 1143 08/02/21 2118  Weight: 61.2 kg 57.4 kg    Examination:  General exam: Appears comfortable, deconditioned, not in any distress. Respiratory system: Clear to auscultation bilaterally. Respiratory effort normal.  RR 15 Cardiovascular system: S1-S2 heard, regular rate and rhythm, no murmur. Gastrointestinal system: Abdomen is soft, nontender, nondistended, BS+ Central nervous system: Alert and oriented x 1. No focal neurological deficits. Extremities: No edema, no cyanosis, no clubbing Skin: No rashes, lesions or ulcers Psychiatry:  Mood & affect appropriate.     Data Reviewed: I have personally reviewed following labs and imaging studies  CBC: Recent Labs  Lab 08/02/21 1217 08/02/21 1719 08/03/21 0421  WBC 7.1 6.5 5.7  NEUTROABS 5.5  --   --   HGB 13.4 12.6 13.7  HCT 40.1 37.6 40.9  MCV 97.8 97.2 96.7  PLT 170 169 630   Basic Metabolic Panel: Recent Labs  Lab 08/02/21 1217 08/02/21 1719 08/03/21 0421  NA 135  --  135  K 3.9  --  3.5  CL 103  --  103  CO2 27  --  22  GLUCOSE 96  --  95  BUN 24*  --  14  CREATININE 0.86 0.66 0.62  CALCIUM 8.4*  --  8.2*  MG  --   --  1.9  PHOS  --   --  2.4*   GFR: Estimated Creatinine Clearance: 39.6 mL/min (by C-G formula based on SCr of 0.62 mg/dL). Liver Function Tests: Recent Labs  Lab 08/02/21 1217 08/03/21 0421  AST 31 49*   ALT 16 21  ALKPHOS 73 68  BILITOT 0.8 0.9  PROT 6.1* 5.9*  ALBUMIN 3.6 3.3*   No results for input(s): LIPASE, AMYLASE in the last 168 hours. No results for input(s): AMMONIA in the last 168 hours. Coagulation Profile: No results for input(s): INR, PROTIME in the last 168 hours. Cardiac Enzymes: Recent Labs  Lab 08/02/21 1217  CKTOTAL 390*   BNP (last 3 results) No results for input(s): PROBNP in the last 8760 hours. HbA1C: No results for input(s): HGBA1C in the last 72 hours. CBG: No results for input(s): GLUCAP in the last 168 hours. Lipid Profile: No results for input(s): CHOL, HDL, LDLCALC, TRIG, CHOLHDL, LDLDIRECT in the last 72 hours. Thyroid Function Tests: No results for input(s): TSH, T4TOTAL, FREET4, T3FREE, THYROIDAB in the last 72 hours. Anemia Panel: No  results for input(s): VITAMINB12, FOLATE, FERRITIN, TIBC, IRON, RETICCTPCT in the last 72 hours. Sepsis Labs: Recent Labs  Lab 08/02/21 1719  LATICACIDVEN 1.0    Recent Results (from the past 240 hour(s))  Urine Culture     Status: Abnormal   Collection Time: 08/02/21 12:16 PM   Specimen: Urine, Clean Catch  Result Value Ref Range Status   Specimen Description   Final    URINE, CLEAN CATCH Performed at [facility], [place] [address] [place], [place] [address]    Special Requests   Final    NONE Performed at [facility], [place] [address], [place], [place] [address]    Culture MULTIPLE SPECIES PRESENT, SUGGEST RECOLLECTION (A)  Final   Report Status 08/03/2021 FINAL  Final  Resp Panel by RT-PCR (Flu A&B, Covid) Nasopharyngeal Swab     Status: Abnormal   Collection Time: 08/02/21  5:19 PM   Specimen: Nasopharyngeal Swab; Nasopharyngeal(NP) swabs in vial transport medium  Result Value Ref Range Status   SARS Coronavirus 2 by RT PCR POSITIVE (A) NEGATIVE Final    Comment: CRITICAL RESULT CALLED TO, READ BACK BY AND VERIFIED WITH:  [place] CUMMINGS RN 08/03/21 @ 0315  VS (NOTE) SARS-CoV-2 target nucleic acids are DETECTED.  The SARS-CoV-2 RNA is generally detectable in upper respiratory specimens during the acute phase of infection. Positive results are indicative of the presence of the identified virus, but do not rule out bacterial infection or co-infection with other pathogens not detected by the test. Clinical correlation with patient history and other diagnostic information is necessary to determine patient infection status. The expected result is Negative.  Fact Sheet for Patients: EntrepreneurPulse.com.au  Fact Sheet for Healthcare Providers: IncredibleEmployment.be  This test is not yet approved or cleared by the [place] FDA and  has been authorized for detection and/or diagnosis of SARS-CoV-2 by FDA under an Emergency Use Authorization (EUA).  This EUA will remain in effect (meaning this  test can be used) for the duration of  the COVID-19 declaration under Section 564(b)(1) of the Act, 21 U.S.C. section 360bbb-3(b)(1), unless the authorization is terminated or revoked sooner.     Influenza A by PCR NEGATIVE NEGATIVE Final   Influenza B by PCR NEGATIVE NEGATIVE Final    Comment: (NOTE) The Xpert Xpress SARS-CoV-2/FLU/RSV plus assay is intended as an aid in the diagnosis of influenza from Nasopharyngeal swab specimens and should not be used as a sole basis for treatment. Nasal washings and aspirates are unacceptable for Xpert Xpress SARS-CoV-2/FLU/RSV testing.  Fact Sheet for Patients: EntrepreneurPulse.com.au  Fact Sheet for Healthcare Providers: IncredibleEmployment.be  This test is not yet approved or cleared by the [place] FDA and has been authorized for detection and/or diagnosis of SARS-CoV-2 by FDA under an Emergency Use Authorization (EUA). This EUA will remain in effect (meaning this test can be used) for the duration of the COVID-19  declaration under Section 564(b)(1) of the Act, 21 U.S.C. section 360bbb-3(b)(1), unless the authorization is terminated or revoked.  Performed at [facility], [place] [address], [place], [place] [address]     Radiology Studies: No results found.  Scheduled Meds:  docusate sodium  100 mg Oral BID   enoxaparin (LOVENOX) injection  40 mg Subcutaneous Q24H   QUEtiapine  25 mg Oral QHS   Continuous Infusions:     LOS: 4 days    Time spent: 25 mins    Shawna Clamp, MD [facility]   If 7PM-7AM,  please contact night-coverage

## 2021-08-07 NOTE — Plan of Care (Signed)
  Problem: Clinical Measurements: Goal: Ability to maintain clinical measurements within normal limits will improve Outcome: Progressing Goal: Respiratory complications will improve Outcome: Progressing   Problem: Elimination: Goal: Will not experience complications related to urinary retention Outcome: Progressing   Problem: Safety: Goal: Ability to remain free from injury will improve Outcome: Progressing   Problem: Education: Goal: Knowledge of risk factors and measures for prevention of condition will improve Outcome: Progressing   Problem: Respiratory: Goal: Complications related to the disease process, condition or treatment will be avoided or minimized Outcome: Progressing

## 2021-08-07 NOTE — Progress Notes (Signed)
Physical Therapy Treatment Patient Details Name: Sophia Hancock MRN: [id number] DOB: May 11, 1928 Today's Date: 08/07/2021   History of Present Illness 87yo female who presented on 08/02/21 with AMS; she fell OOB and was found under the bed by family, who also reports she has been hallucinating recently. Imaging negative for fracture, but Covid +. Admitted with UTI, covid, and hx of fall at home. PMH dementia, HTN, HOH, SVT    PT Comments    Pt in recliner with Son visiting and helping her eat lunch.  General Comments: AxO x 1 requiring 75% repeat VC's.  Pt restless requiring re direction to task.  Pulling on her lines/gown strings etc. Assisted with amb was very difficult.  General transfer comment: Assisted out of recliner required Max Assist to rise.  Severe RIGHT lean with no self correction. Poor balance.  Required Total Assist to prevent fall.  Son assisted.  Pt grabbing randomly to attempt self correction.  Gave her the walker to "hold the handles" to off set her need to hold something.  Still required Total Assist to achieve partial stance and correct RIGHT lean.  Narrow base of support.  Also assisted with toilet transfer.  Pt required Total Assist to target and control sitting onto toilet.  Increased fear/anxiety as well.  "Don't let me fall". General Gait Details: VERY unsteady gait.  Limited distance to and from bathroom.  Severe RIGHT lean.  Difficulty stepping.  Therapist "wrapped" around pt's waist to correct RIGHT lean and tactile cueing to advance forward and navigate around bed all while advancing walker.  75% VC's to "hold the handle bars" and "step up".  Distance limited by impaired cognition.  Pt is NOT safe to amb to bathroom with nursing staff. Pt will need ST Rehab at SNF.   Recommendations for follow up therapy are one component of a multi-disciplinary discharge planning process, led by the attending physician.  Recommendations may be updated based on patient status, additional  functional criteria and insurance authorization.  Follow Up Recommendations  Skilled nursing-short term rehab (<3 hours/day)     Assistance Recommended at Discharge Frequent or constant Supervision/Assistance  Equipment Recommendations  Rolling walker (2 wheels);BSC/3in1;Wheelchair (measurements PT);Wheelchair cushion (measurements PT)    Recommendations for Other Services       Precautions / Restrictions Precautions Precautions: Fall Precaution Comments: Covid +, HOH Restrictions Weight Bearing Restrictions: No     Mobility  Bed Mobility               General bed mobility comments: OOB in recliner    Transfers Overall transfer level: Needs assistance   Transfers: Sit to/from Stand Sit to Stand: Max assist;Total assist Stand pivot transfers: Max assist;Total assist         General transfer comment: Assisted out of recliner required Max Assist to rise.  Severe RIGHT lean with no self correction. Poor balance.  Required Total Assist to prevent fall.  Son assisted.  Pt grabbing randomly to attempt self correction.  Gave her the walker to "hold the handles" to off set her need to hold something.  Still required Total Assist to achieve partial stance and correct RIGHT lean.  Narrow base of support.  Also assisted with toilet transfer.  Pt required Total Assist to target and control sitting onto toilet.  Increased fear/anxiety as well.  "Don't let me fall".    Ambulation/Gait Ambulation/Gait assistance: Max assist;Total assist Gait Distance (Feet): 16 Feet Assistive device: Rolling walker (2 wheels) Gait Pattern/deviations: Step-to pattern;Decreased stance time -  right;Narrow base of support;Staggering right Gait velocity: decrease     General Gait Details: VERY unsteady gait.  Limited distance to and from bathroom.  Severe RIGHT lean.  Difficulty stepping.  Therapist "wrapped" around pt's waist to correct RIGHT lean and tactile cueing to advance forward and navigate  around bed all while advancing walker.  75% VC's to "hold the handle bars" and "step up".  Distance limited by impaired cognition.  Pt is NOT safe to amb to bathroom with nursing staff.   Stairs             Wheelchair Mobility    Modified Rankin (Stroke Patients Only)       Balance                                            Cognition Arousal/Alertness: Awake/alert   Overall Cognitive Status: History of cognitive impairments - at baseline                                 General Comments: AxO x 1 requiring 75% repeat VC's.  Pt restless requiring re direction to task.  Pulling on her lines/gown strings etc.        Exercises      General Comments        Pertinent Vitals/Pain Pain Assessment: Faces Faces Pain Scale: Hurts a little bit Pain Location: HA Pain Descriptors / Indicators: Grimacing Pain Intervention(s): Monitored during session    Home Living                          Prior Function            PT Goals (current goals can now be found in the care plan section) Progress towards PT goals: Progressing toward goals    Frequency    Min 2X/week      PT Plan Current plan remains appropriate    Co-evaluation              AM-PAC PT "6 Clicks" Mobility   Outcome Measure  Help needed turning from your back to your side while in a flat bed without using bedrails?: A Lot Help needed moving from lying on your back to sitting on the side of a flat bed without using bedrails?: Total Help needed moving to and from a bed to a chair (including a wheelchair)?: Total Help needed standing up from a chair using your arms (e.g., wheelchair or bedside chair)?: Total Help needed to walk in hospital room?: Total Help needed climbing 3-5 steps with a railing? : Total 6 Click Score: 7    End of Session Equipment Utilized During Treatment: Gait belt Activity Tolerance: Patient tolerated treatment well Patient  left: in chair Nurse Communication: Mobility status PT Visit Diagnosis: Unsteadiness on feet (R26.81);Muscle weakness (generalized) (M62.81);Difficulty in walking, not elsewhere classified (R26.2);History of falling (Z91.81)     Time: 3220-2542 PT Time Calculation (min) (ACUTE ONLY): 28 min  Charges:  $Gait Training: 8-22 mins $Therapeutic Activity: 8-22 mins                     Rica Koyanagi  PTA Acute  Rehabilitation Services Pager      [phone] Office      [phone]

## 2021-08-07 NOTE — Plan of Care (Signed)
  Problem: Clinical Measurements: Goal: Ability to maintain clinical measurements within normal limits will improve Outcome: Progressing Goal: Respiratory complications will improve Outcome: Progressing   Problem: Elimination: Goal: Will not experience complications related to urinary retention Outcome: Progressing   Problem: Safety: Goal: Ability to remain free from injury will improve Outcome: Progressing

## 2021-08-08 LAB — CBC
HCT: 38.8 % (ref 36.0–46.0)
Hemoglobin: 13.1 g/dL (ref 12.0–15.0)
MCH: 32.3 pg (ref 26.0–34.0)
MCHC: 33.8 g/dL (ref 30.0–36.0)
MCV: 95.8 fL (ref 80.0–100.0)
Platelets: 210 10*3/uL (ref 150–400)
RBC: 4.05 MIL/uL (ref 3.87–5.11)
RDW: 12.3 % (ref 11.5–15.5)
WBC: 7.4 10*3/uL (ref 4.0–10.5)
nRBC: 0 % (ref 0.0–0.2)

## 2021-08-08 LAB — BASIC METABOLIC PANEL
Anion gap: 10 (ref 5–15)
BUN: 23 mg/dL (ref 8–23)
CO2: 25 mmol/L (ref 22–32)
Calcium: 8.4 mg/dL — ABNORMAL LOW (ref 8.9–10.3)
Chloride: 102 mmol/L (ref 98–111)
Creatinine, Ser: 0.68 mg/dL (ref 0.44–1.00)
GFR, Estimated: >60 mL/min (ref >60)
Glucose, Bld: 97 mg/dL (ref 70–99)
Potassium: 3 mmol/L — ABNORMAL LOW (ref 3.5–5.1)
Sodium: 137 mmol/L (ref 135–145)

## 2021-08-08 LAB — PHOSPHORUS: Phosphorus: 3.2 mg/dL (ref 2.5–4.6)

## 2021-08-08 LAB — MAGNESIUM: Magnesium: 1.8 mg/dL (ref 1.7–2.4)

## 2021-08-08 MED ORDER — POTASSIUM CHLORIDE CRYS ER 20 MEQ PO TBCR
40.0000 meq | EXTENDED_RELEASE_TABLET | Freq: Two times a day (BID) | ORAL | Status: AC
  Administered 2021-08-08: 12:00:00 40 meq via ORAL
  Filled 2021-08-08 (×2): qty 2

## 2021-08-08 NOTE — Progress Notes (Signed)
Occupational Therapy Treatment Patient Details Name: Sophia Hancock MRN: [id number] DOB: 1928/05/23 Today's Date: 08/08/2021   History of present illness 87yo female who presented on 08/02/21 with AMS; she fell OOB and was found under the bed by family, who also reports she has been hallucinating recently. Imaging negative for fracture, but Covid +. Admitted with UTI, covid, and hx of fall at home. PMH dementia, HTN, HOH, SVT   OT comments  Patient and staff requested for patient to get to recliner for lunch. Patient was noted to be lethargic during session but motivated to get out of bed. Patient was max A for scoot transfer to recliner with min A to maintain sitting balance. Patient was mod A to scoot back into recliner with increased time. Patient's discharge plan remains appropriate at this time. OT will continue to follow acutely.     Recommendations for follow up therapy are one component of a multi-disciplinary discharge planning process, led by the attending physician.  Recommendations may be updated based on patient status, additional functional criteria and insurance authorization.    Follow Up Recommendations  Skilled nursing-short term rehab (<3 hours/day)    Assistance Recommended at Discharge Frequent or constant Supervision/Assistance  Equipment Recommendations  BSC/3in1;Hospital bed;Wheelchair (measurements OT)    Recommendations for Other Services      Precautions / Restrictions Precautions Precautions: Fall Precaution Comments: Covid +, HOH Restrictions Weight Bearing Restrictions: No       Mobility Bed Mobility Overal bed mobility: Needs Assistance Bed Mobility: Supine to Sit;Sit to Supine     Supine to sit: HOB elevated;Total assist          Transfers Overall transfer level: Needs assistance   Transfers: Bed to chair/wheelchair/BSC             General transfer comment: patient required max A to get to recliner with lateral lean to R not as  severe on this date. patient was noted to be increasingly lethargic on this date. sitter in room at end of session     Balance Overall balance assessment: Needs assistance Sitting-balance support: Bilateral upper extremity supported;Feet supported Sitting balance-Leahy Scale: Poor Sitting balance - Comments: min guard-MinA but does tend to lean if not cued Postural control: Right lateral lean                                 ADL either performed or assessed with clinical judgement   ADL Overall ADL's : Needs assistance/impaired                                       General ADL Comments: patient was lying in bed with nurse and sitter in room reporting she wanted to get out of bed. patient was max A for supine to sit on edge of bed with patietn becoming more alert sitting on edge of bed thanking therapist for getting her out of bed. patient was noted to need increased cues to maintain attention on task. patient was max A for scoot trasnfer from edge of bed to recline in room. with mod A to scoot backwards in chair. patient was set up for lunch with max cues to have patient awake to attempt to eat with lethargic presentation winning in the end.    Extremity/Trunk Assessment  Vision       Perception     Praxis      Cognition Arousal/Alertness: Lethargic Behavior During Therapy: Flat affect;Restless Overall Cognitive Status: History of cognitive impairments - at baseline                                            Exercises     Shoulder Instructions       General Comments      Pertinent Vitals/ Pain       Pain Assessment: Faces Faces Pain Scale: Hurts a little bit Pain Location: all over Pain Descriptors / Indicators: Grimacing Pain Intervention(s): Monitored during session  Home Living                                          Prior Functioning/Environment              Frequency   Min 2X/week        Progress Toward Goals  OT Goals(current goals can now be found in the care plan section)  Progress towards OT goals: Not progressing toward goals - comment (patient very lethargic today)     Plan Discharge plan remains appropriate    Co-evaluation                 AM-PAC OT "6 Clicks" Daily Activity     Outcome Measure   Help from another person eating meals?: Total Help from another person taking care of personal grooming?: Total Help from another person toileting, which includes using toliet, bedpan, or urinal?: Total Help from another person bathing (including washing, rinsing, drying)?: Total Help from another person to put on and taking off regular upper body clothing?: Total Help from another person to put on and taking off regular lower body clothing?: Total 6 Click Score: 6    End of Session Equipment Utilized During Treatment: Gait belt  OT Visit Diagnosis: Unsteadiness on feet (R26.81);Other abnormalities of gait and mobility (R26.89);Muscle weakness (generalized) (M62.81);Other symptoms and signs involving cognitive function   Activity Tolerance Patient limited by lethargy   Patient Left with call bell/phone within reach;in chair;with nursing/sitter in room   Nurse Communication Other (comment) (nurse present during session)        Time: 1131-1146 OT Time Calculation (min): 15 min  Charges: OT General Charges $OT Visit: 1 Visit OT Treatments $Therapeutic Activity: 8-22 mins  Jackelyn Poling OTR/L, MS Acute Rehabilitation Department Office# [phone] Pager# [phone]   Marcellina Millin 08/08/2021, 12:50 PM

## 2021-08-08 NOTE — Progress Notes (Signed)
PROGRESS NOTE    Sophia Hancock  OZH:[id number] DOB: 1928/04/12 DOA: 08/02/2021 PCP: Curly Rim, MD   Brief Narrative:  This 87 years old female with PMH significant for dementia, hypertension, hard of hearing, arthritis, history of SVT brought in by EMS from home with altered mental status.  Patient was found under the bed by her family not sure how long she was under the bed.  Assuming she might have fallen from bed and remained there throughout the night.  Patient has acquired COVID infection 2 days ago from [place],  her symptoms are very mild,  daughter reports having runny nose and slight congestion but denies any cough or shortness of breath. Patient is admitted for acute confusion could be secondary to UTI and COVID infection.  She has completed antibiotics for UTI.  PT recommended  skilled nursing facility.  She is medically clear , since she is COVID+, She will need to stay in hospital 10 days before she can be discharged to SNF.  08/08/21: Patient seen and examined at bedside.  She is somnolent but she is arousable to voices.  One-to-one sitter in the room.  No new complaints.  Assessment & Plan:   Principal Problem:   Altered mental status Active Problems:   Hypertension   SVT (supraventricular tachycardia) (HCC)   Hearing loss   Urinary incontinence   Osteoporosis   Chronic kidney disease (CKD), stage III (moderate) (HCC)  Altered mental status could be multifactorial: > Improving  Suspect multifactorial, could be UTI, COVID+, Dehydration , decreased PO Patient was found on the floor, unsure how long she was on floor. CK 395, not very high.  UA consistent with UTI. Continue IV hydration, completed antibiotics for UTI. Urine culture contaminated. CT head unremarkable, CT C-spine unremarkable. It appears she is now getting back to her baseline mental status.   UTI: UA consistent with UTI, Continued Ceftriaxone, urine culture contaminated. Patient does  not meet sepsis criteria. Lactic acid normal.  Discontinue ceftriaxone,  received 3 days of ceftriaxone.   COVID+: Patient acquired COVID infection from [facility].   Patient not having any significant respiratory symptoms. Continue airborne and droplet precautions. Continue supportive care   Fall: Suspect she might have fallen from bed and remained on the floor throughout night. Skeletal work-up completely unremarkable. CK slightly elevated,  Continue gentle IV hydration. PT and OT recommended skilled nursing facility.   Essential hypertension: Hold hydrochlorothiazide, Continue  hydralazine as needed.  Delirium in the setting of dementia: Continue delirium precautions. Continue  Seroquel 25 mg at bedtime. Patient remains intermittently confused,  trying to get out of bed. Patient needs safety sitter to avoid falls in the night. Family stays with patient during day time.   DVT prophylaxis:Lovenox Code Status: DNR Family Communication: Son in law in the room Disposition Plan:     Status is: Inpatient  Remains inpatient appropriate because: inpatient  Altered mental status could be secondary to UTI and COVID infection.    Patient is medically clear for discharge: Yes  Patient needs SNF placement, severely deconditioned.  She is COVID-positive on 08/03/2021.  She can be accepted in  nursing home on 08/13/2021.   Consultants:  None  Procedures: CT head and C-spine.  Antimicrobials:  Anti-infectives (From admission, onward)    Start     Dose/Rate Route Frequency Ordered Stop   08/03/21 1400  cefTRIAXone (ROCEPHIN) 1 g in sodium chloride 0.9 % 100 mL IVPB  Status:  Discontinued  1 g 200 mL/hr over 30 Minutes Intravenous Every 24 hours 08/02/21 1607 08/06/21 0801   08/02/21 1315  cefTRIAXone (ROCEPHIN) 1 g in sodium chloride 0.9 % 100 mL IVPB        1 g 200 mL/hr over 30 Minutes Intravenous  Once 08/02/21 1303 08/02/21 1348          Objective: Vitals:   08/06/21 1949 08/07/21 0500 08/07/21 1432 08/08/21 0521  BP: (!) 157/83 (!) 117/95 133/87 108/63  Pulse: 76 92 87 74  Resp: 18 20 19 15   Temp: 98.4 F (36.9 C) 98 F (36.7 C) 97.9 F (36.6 C) 98.3 F (36.8 C)  TempSrc: Oral  Oral Oral  SpO2:  100% 100% 98%  Weight:      Height:        Intake/Output Summary (Last 24 hours) at 08/08/2021 1439 Last data filed at 08/07/2021 1854 Gross per 24 hour  Intake 110 ml  Output --  Net 110 ml   Filed Weights   08/02/21 1143 08/02/21 2118  Weight: 61.2 kg 57.4 kg    Examination: No significant changes from prior exam.  General exam: Appears comfortable, deconditioned, not in any distress. Respiratory system: Clear to auscultation bilaterally. Respiratory effort normal.  RR 15 Cardiovascular system: S1-S2 heard, regular rate and rhythm, no murmur. Gastrointestinal system: Abdomen is soft, nontender, nondistended, BS+ Central nervous system: Alert and oriented x 1. No focal neurological deficits. Extremities: No edema, no cyanosis, no clubbing Skin: No rashes, lesions or ulcers Psychiatry:  Mood & affect appropriate.     Data Reviewed: I have personally reviewed following labs and imaging studies  CBC: Recent Labs  Lab 08/02/21 1217 08/02/21 1719 08/03/21 0421 08/08/21 0358  WBC 7.1 6.5 5.7 7.4  NEUTROABS 5.5  --   --   --   HGB 13.4 12.6 13.7 13.1  HCT 40.1 37.6 40.9 38.8  MCV 97.8 97.2 96.7 95.8  PLT 170 169 179 502   Basic Metabolic Panel: Recent Labs  Lab 08/02/21 1217 08/02/21 1719 08/03/21 0421 08/08/21 0358  NA 135  --  135 137  K 3.9  --  3.5 3.0*  CL 103  --  103 102  CO2 27  --  22 25  GLUCOSE 96  --  95 97  BUN 24*  --  14 23  CREATININE 0.86 0.66 0.62 0.68  CALCIUM 8.4*  --  8.2* 8.4*  MG  --   --  1.9 1.8  PHOS  --   --  2.4* 3.2   GFR: Estimated Creatinine Clearance: 39.6 mL/min (by C-G formula based on SCr of 0.68 mg/dL). Liver Function Tests: Recent Labs  Lab  08/02/21 1217 08/03/21 0421  AST 31 49*  ALT 16 21  ALKPHOS 73 68  BILITOT 0.8 0.9  PROT 6.1* 5.9*  ALBUMIN 3.6 3.3*   No results for input(s): LIPASE, AMYLASE in the last 168 hours. No results for input(s): AMMONIA in the last 168 hours. Coagulation Profile: No results for input(s): INR, PROTIME in the last 168 hours. Cardiac Enzymes: Recent Labs  Lab 08/02/21 1217  CKTOTAL 390*   BNP (last 3 results) No results for input(s): PROBNP in the last 8760 hours. HbA1C: No results for input(s): HGBA1C in the last 72 hours. CBG: No results for input(s): GLUCAP in the last 168 hours. Lipid Profile: No results for input(s): CHOL, HDL, LDLCALC, TRIG, CHOLHDL, LDLDIRECT in the last 72 hours. Thyroid Function Tests: No results for input(s): TSH, T4TOTAL, FREET4,  T3FREE, THYROIDAB in the last 72 hours. Anemia Panel: No results for input(s): VITAMINB12, FOLATE, FERRITIN, TIBC, IRON, RETICCTPCT in the last 72 hours. Sepsis Labs: Recent Labs  Lab 08/02/21 1719  LATICACIDVEN 1.0    Recent Results (from the past 240 hour(s))  Urine Culture     Status: Abnormal   Collection Time: 08/02/21 12:16 PM   Specimen: Urine, Clean Catch  Result Value Ref Range Status   Specimen Description   Final    URINE, CLEAN CATCH Performed at [facility], [place] [address], [place], [place] [address]    Special Requests   Final    NONE Performed at [facility], [place] [address] [place], [place] [address]    Culture MULTIPLE SPECIES PRESENT, SUGGEST RECOLLECTION (A)  Final   Report Status 08/03/2021 FINAL  Final  Resp Panel by RT-PCR (Flu A&B, Covid) Nasopharyngeal Swab     Status: Abnormal   Collection Time: 08/02/21  5:19 PM   Specimen: Nasopharyngeal Swab; Nasopharyngeal(NP) swabs in vial transport medium  Result Value Ref Range Status   SARS Coronavirus 2 by RT PCR POSITIVE (A) NEGATIVE Final    Comment: CRITICAL RESULT CALLED TO, READ BACK BY AND VERIFIED  WITH:  [place] CUMMINGS RN 08/03/21 @ 0315 VS (NOTE) SARS-CoV-2 target nucleic acids are DETECTED.  The SARS-CoV-2 RNA is generally detectable in upper respiratory specimens during the acute phase of infection. Positive results are indicative of the presence of the identified virus, but do not rule out bacterial infection or co-infection with other pathogens not detected by the test. Clinical correlation with patient history and other diagnostic information is necessary to determine patient infection status. The expected result is Negative.  Fact Sheet for Patients: EntrepreneurPulse.com.au  Fact Sheet for Healthcare Providers: IncredibleEmployment.be  This test is not yet approved or cleared by the [place] FDA and  has been authorized for detection and/or diagnosis of SARS-CoV-2 by FDA under an Emergency Use Authorization (EUA).  This EUA will remain in effect (meaning this  test can be used) for the duration of  the COVID-19 declaration under Section 564(b)(1) of the Act, 21 U.S.C. section 360bbb-3(b)(1), unless the authorization is terminated or revoked sooner.     Influenza A by PCR NEGATIVE NEGATIVE Final   Influenza B by PCR NEGATIVE NEGATIVE Final    Comment: (NOTE) The Xpert Xpress SARS-CoV-2/FLU/RSV plus assay is intended as an aid in the diagnosis of influenza from Nasopharyngeal swab specimens and should not be used as a sole basis for treatment. Nasal washings and aspirates are unacceptable for Xpert Xpress SARS-CoV-2/FLU/RSV testing.  Fact Sheet for Patients: EntrepreneurPulse.com.au  Fact Sheet for Healthcare Providers: IncredibleEmployment.be  This test is not yet approved or cleared by the [place] FDA and has been authorized for detection and/or diagnosis of SARS-CoV-2 by FDA under an Emergency Use Authorization (EUA). This EUA will remain in effect (meaning this test can be  used) for the duration of the COVID-19 declaration under Section 564(b)(1) of the Act, 21 U.S.C. section 360bbb-3(b)(1), unless the authorization is terminated or revoked.  Performed at [facility], [place] [address], [place], [place] [address]     Radiology Studies: No results found.  Scheduled Meds:  docusate sodium  100 mg Oral BID   enoxaparin (LOVENOX) injection  40 mg Subcutaneous Q24H   potassium chloride  40 mEq Oral BID   QUEtiapine  25 mg Oral QHS   Continuous Infusions:     LOS: 5 days  Time spent: 25 mins    Kayleen Memos, MD [facility]   If 7PM-7AM, please contact night-coverage

## 2021-08-09 LAB — CREATININE, SERUM
Creatinine, Ser: 0.79 mg/dL (ref 0.44–1.00)
GFR, Estimated: >60 mL/min (ref >60)

## 2021-08-09 MED ORDER — QUETIAPINE FUMARATE 25 MG PO TABS
12.5000 mg | ORAL_TABLET | Freq: Two times a day (BID) | ORAL | Status: DC
  Administered 2021-08-09 – 2021-08-14 (×12): 12.5 mg via ORAL
  Filled 2021-08-09 (×12): qty 1

## 2021-08-09 MED ORDER — MELATONIN 3 MG PO TABS
3.0000 mg | ORAL_TABLET | Freq: Every day | ORAL | Status: DC
  Administered 2021-08-09 – 2021-08-11 (×3): 3 mg via ORAL
  Filled 2021-08-09 (×3): qty 1

## 2021-08-09 MED ORDER — QUETIAPINE FUMARATE 25 MG PO TABS: 12.5000 mg | ORAL_TABLET | Freq: Every day | ORAL | Status: DC

## 2021-08-09 NOTE — Progress Notes (Incomplete)
Pt has had 2 formed brown  stools for today.

## 2021-08-09 NOTE — Plan of Care (Signed)
°  Problem: Clinical Measurements: °Goal: Ability to maintain clinical measurements within normal limits will improve °Outcome: Progressing °Goal: Will remain free from infection °Outcome: Progressing °Goal: Diagnostic test results will improve °Outcome: Progressing °Goal: Respiratory complications will improve °Outcome: Progressing °  °

## 2021-08-09 NOTE — Plan of Care (Signed)
°  Problem: Clinical Measurements: Goal: Ability to maintain clinical measurements within normal limits will improve Outcome: Progressing Goal: Will remain free from infection Outcome: Progressing Goal: Diagnostic test results will improve Outcome: Progressing Goal: Respiratory complications will improve Outcome: Progressing Goal: Cardiovascular complication will be avoided Outcome: Progressing   Problem: Elimination: Goal: Will not experience complications related to urinary retention Outcome: Progressing   Problem: Safety: Goal: Ability to remain free from injury will improve Outcome: Progressing   Problem: Activity: Goal: Risk for activity intolerance will decrease Outcome: Not Progressing   Problem: Nutrition: Goal: Adequate nutrition will be maintained Outcome: Not Progressing   Problem: Coping: Goal: Level of anxiety will decrease Outcome: Not Progressing   Problem: Elimination: Goal: Will not experience complications related to bowel motility Outcome: Not Progressing   Problem: Pain Managment: Goal: General experience of comfort will improve Outcome: Not Progressing   Problem: Skin Integrity: Goal: Risk for impaired skin integrity will decrease Outcome: Not Progressing

## 2021-08-09 NOTE — Progress Notes (Addendum)
Chaplain attempted to contact daughter/family by phone and offer support.    Chaplain is offering prayer for Sophia Hancock. Chaplain will follow-up.     08/09/21 0900  Clinical Encounter Type  Visited With Patient not available  Visit Type Initial;Spiritual support

## 2021-08-09 NOTE — TOC Transition Note (Addendum)
Transition of Care [facility]) - CM/SW Discharge Note   Patient Details  Name: [name] MRN: [id number] Date of Birth: July 15, 1928  Transition of Care [facility]) CM/SW Contact:  Purcell Mouton, RN Phone Number: 08/09/2021, 3:09 PM   Clinical Narrative:    Spoke with pt concerning SNF for pt. Daughter Wells Guiles asked for [facility] or [facility]. At this present time [facility] declined pt. Will refax and check with these two facilities when pt is close to discharge for daughter. Pt is COVID positive, SNF will not take her until day 11-(12/19). This was explained to pt. Pt will also need to be sitter free for 24 hours before discharging to SNF.    Final next level of care: [place] Barriers to Discharge: Continued Medical Work up   Patient Goals and CMS Choice Patient states their goals for this hospitalization and ongoing recovery are:: To go to SNF for short term rehab, then return back home. CMS Medicare.gov Compare Post Acute Care list provided to:: Patient Represenative (must comment) Choice offered to / list presented to : Adult Children  Discharge Placement                       Discharge Plan and Services     Post Acute Care Choice: Skilled Nursing Facility                               Social Determinants of Health (SDOH) Interventions     Readmission Risk Interventions No flowsheet data found.

## 2021-08-09 NOTE — Progress Notes (Signed)
Physical Therapy Treatment Patient Details Name: Sophia Hancock MRN: [id number] DOB: [birth year] Today's Date: 08/09/2021   History of Present Illness 87yo female who presented on 08/02/21 with AMS; she fell OOB and was found under the bed by family, who also reports she has been hallucinating recently. Imaging negative for fracture, but Covid +. Admitted with UTI, covid, and hx of fall at home. PMH dementia, HTN, HOH, SVT    PT Comments    Improved mobility and balance today. Pt is confused, oriented to self only. She ambulated 49' with RW and min A to negotiate obstacles.  She tolerated increased ambulation distance and required less physical assistance for balance compared to prior PT session.    Recommendations for follow up therapy are one component of a multi-disciplinary discharge planning process, led by the attending physician.  Recommendations may be updated based on patient status, additional functional criteria and insurance authorization.  Follow Up Recommendations  Skilled nursing-short term rehab (<3 hours/day)     Assistance Recommended at Discharge Frequent or constant Supervision/Assistance  Equipment Recommendations  Rolling walker (2 wheels);BSC/3in1;Wheelchair (measurements PT);Wheelchair cushion (measurements PT)    Recommendations for Other Services       Precautions / Restrictions Precautions Precautions: Fall Precaution Comments: Covid +, HOH Restrictions Weight Bearing Restrictions: No     Mobility  Bed Mobility               General bed mobility comments: up on bedside commode with sitter in room    Transfers Overall transfer level: Needs assistance Equipment used: Rolling walker (2 wheels)   Sit to Stand: Min assist           General transfer comment: manual and verbal cues for hand placement, min A to power up    Ambulation/Gait Ambulation/Gait assistance: Min assist Gait Distance (Feet): 30 Feet Assistive device: Rolling walker  (2 wheels) Gait Pattern/deviations: Decreased stride length;Decreased step length - right;Decreased step length - left;Shuffle Gait velocity: decrease     General Gait Details: steady, no loss of balance, no leaning this session, manual guidance for forward progression and to guide hips with turning to recliner and with stand to sit   Stairs             Wheelchair Mobility    Modified Rankin (Stroke Patients Only)       Balance Overall balance assessment: Needs assistance Sitting-balance support: Feet supported;No upper extremity supported Sitting balance-Leahy Scale: Fair     Standing balance support: Bilateral upper extremity supported Standing balance-Leahy Scale: Poor                              Cognition Arousal/Alertness: Awake/alert Behavior During Therapy: Anxious Overall Cognitive Status: No family/caregiver present to determine baseline cognitive functioning Area of Impairment: Orientation;Attention;Following commands;Safety/judgement;Awareness;Problem solving                 Orientation Level: Disoriented to;Place;Time;Situation     Following Commands: Follows one step commands inconsistently Safety/Judgement: Decreased awareness of safety;Decreased awareness of deficits   Problem Solving: Difficulty sequencing;Requires verbal cues;Requires tactile cues;Slow processing General Comments: AxO x 1 requiring 75% repeat VC's.  Pt restless requiring re direction to task.  Pulling on her lines/gown strings etc.        Exercises      General Comments        Pertinent Vitals/Pain Faces Pain Scale: No hurt    Home Living  Prior Function            PT Goals (current goals can now be found in the care plan section) Acute Rehab PT Goals Patient Stated Goal: SNF for rehab PT Goal Formulation: Patient unable to participate in goal setting Time For Goal Achievement: 08/17/21 Potential to Achieve  Goals: Good Progress towards PT goals: Progressing toward goals    Frequency    Min 2X/week      PT Plan Current plan remains appropriate    Co-evaluation              AM-PAC PT "6 Clicks" Mobility   Outcome Measure  Help needed turning from your back to your side while in a flat bed without using bedrails?: A Lot Help needed moving from lying on your back to sitting on the side of a flat bed without using bedrails?: A Lot Help needed moving to and from a bed to a chair (including a wheelchair)?: A Little Help needed standing up from a chair using your arms (e.g., wheelchair or bedside chair)?: A Little Help needed to walk in hospital room?: A Lot Help needed climbing 3-5 steps with a railing? : A Lot 6 Click Score: 14    End of Session Equipment Utilized During Treatment: Gait belt Activity Tolerance: Patient tolerated treatment well Patient left: in chair;with call bell/phone within reach;with nursing/sitter in room;with chair alarm set Nurse Communication: Mobility status PT Visit Diagnosis: Unsteadiness on feet (R26.81);Muscle weakness (generalized) (M62.81);Difficulty in walking, not elsewhere classified (R26.2);History of falling (Z91.81)     Time: 3536-1443 PT Time Calculation (min) (ACUTE ONLY): 18 min  Charges:  $Therapeutic Activity: 8-22 mins                     Blondell Reveal Kistler PT 08/09/2021  Acute Rehabilitation Services Pager [phone] Office [phone]

## 2021-08-09 NOTE — Progress Notes (Signed)
PROGRESS NOTE    Sophia Hancock  ELF:[id number] DOB: 1928/02/04 DOA: 08/02/2021 PCP: Curly Rim, MD   Brief Narrative:  87 years old female with PMH significant for dementia, hypertension, hearing impairment, arthritis, history of SVT brought in by EMS from home with altered mental status.  Patient was found under the bed by her family not sure how long she was under the bed.  Assuming she might have fallen from bed and remained there throughout the night.  Patient has acquired COVID infection 2 days ago from [place],  her symptoms are very mild,  daughter reports having runny nose and slight congestion but denies any cough or shortness of breath. Patient admitted for acute confusion thought secondary to UTI and COVID infection.  She has completed antibiotics for UTI.  PT recommended  skilled nursing facility.  She is medically clear , since she is COVID+, She will need to stay in hospital 10 days before she can be discharged to SNF.  Last day of isolation 08/12/2021.  08/09/21: Seen at bedside.  She is alert and pleasantly confused.  One-to-one sitter in the room.  Assessment & Plan:   Principal Problem:   Altered mental status Active Problems:   Hypertension   SVT (supraventricular tachycardia) (HCC)   Hearing loss   Urinary incontinence   Osteoporosis   Chronic kidney disease (CKD), stage III (moderate) (HCC)  Acute metabolic encephalopathy/delirium, multifactorial: Suspect multifactorial, secondary to UTI, COVID+, Dehydration , decreased PO Patient was found on the floor, unsure how long she was on floor. CK 395.  UA consistent with UTI. Completed IV antibiotics and IV hydration Urine culture contaminated. CT head unremarkable, CT C-spine unremarkable. It appears she is now getting back to her baseline mental status. Continue delirium precaution, nonpharmacological and pharmacological delirium precautions. Continue Seroquel 12.5 mg twice daily.  Add melatonin 3  mg nightly Reorient frequently Continue one-to-one sitter Continue delirium and fall precautions.   Treated UTI: UA consistent with UTI, Completed ceftriaxone, urine culture contaminated. Patient does not meet sepsis criteria. Lactic acid normal.  Discontinue ceftriaxone,  received 3 days of ceftriaxone.   COVID+: Oxygen saturation 99% on room air. Continue airborne and droplet precautions. Continue supportive care   Fall: Suspect she might have fallen from bed and remained on the floor throughout night. Skeletal work-up completely unremarkable. CK slightly elevated, received gentle IV hydration. PT and OT recommended skilled nursing facility.   Essential hypertension: BP is at goal Continue to hold hydrochlorothiazide to avoid hypotension, Maintain MAP greater than 65.  Delirium in the setting of dementia: Continue delirium precautions. Seroquel 12.5 mg twice daily. Melatonin 3 mg nightly. Delirium precautions Fall precautions Reorient frequently   DVT prophylaxis:Lovenox subcu daily. Code Status: DNR Family Communication: Updated her daughter via phone on 01/07/2021. Disposition Plan:     Status is: Inpatient  Remains inpatient appropriate because: inpatient  Altered mental status could be secondary to UTI and COVID infection.    Patient is medically clear for discharge: Yes  Patient needs SNF placement, severely deconditioned.  She is COVID-positive on 12/82022.  She can be accepted in  nursing home on 08/12/2021.   Consultants:  None  Procedures: CT head and C-spine.  Antimicrobials:  Anti-infectives (From admission, onward)    Start     Dose/Rate Route Frequency Ordered Stop   08/03/21 1400  cefTRIAXone (ROCEPHIN) 1 g in sodium chloride 0.9 % 100 mL IVPB  Status:  Discontinued        1 g  200 mL/hr over 30 Minutes Intravenous Every 24 hours 08/02/21 1607 08/06/21 0801   08/02/21 1315  cefTRIAXone (ROCEPHIN) 1 g in sodium chloride 0.9 % 100 mL  IVPB        1 g 200 mL/hr over 30 Minutes Intravenous  Once 08/02/21 1303 08/02/21 1348         Objective: Vitals:   08/07/21 1432 08/08/21 0521 08/08/21 2022 08/09/21 0533  BP: 133/87 108/63 115/72 (!) 111/57  Pulse: 87 74 86 64  Resp: 19 15 17 16   Temp: 97.9 F (36.6 C) 98.3 F (36.8 C) 97.9 F (36.6 C) 97.8 F (36.6 C)  TempSrc: Oral Oral Oral   SpO2: 100% 98% 92% 99%  Weight:      Height:        Intake/Output Summary (Last 24 hours) at 08/09/2021 1418 Last data filed at 08/09/2021 0559 Gross per 24 hour  Intake 240 ml  Output 125 ml  Net 115 ml   Filed Weights   08/02/21 1143 08/02/21 2118  Weight: 61.2 kg 57.4 kg    Examination:   General exam: Frail-appearing no acute distress.  She is alert and pleasantly confused.   Respiratory system: Clear to auscultation with no wheezes or rales.   Cardiovascular system: Regular rate and rhythm no rubs or gallops.   Gastrointestinal system: Soft nontender normal bowel sounds present.  Nondistended.   Central nervous system: Alert and pleasantly confused.  Nonfocal.  Extremities: No lower extremity edema bilaterally. Skin: No rashes or ulcerative lesions. Psychiatry: Mood is appropriate for condition and setting.    Data Reviewed: I have personally reviewed following labs and imaging studies  CBC: Recent Labs  Lab 08/02/21 1719 08/03/21 0421 08/08/21 0358  WBC 6.5 5.7 7.4  HGB 12.6 13.7 13.1  HCT 37.6 40.9 38.8  MCV 97.2 96.7 95.8  PLT 169 179 562   Basic Metabolic Panel: Recent Labs  Lab 08/02/21 1719 08/03/21 0421 08/08/21 0358 08/09/21 0346  NA  --  135 137  --   K  --  3.5 3.0*  --   CL  --  103 102  --   CO2  --  22 25  --   GLUCOSE  --  95 97  --   BUN  --  14 23  --   CREATININE 0.66 0.62 0.68 0.79  CALCIUM  --  8.2* 8.4*  --   MG  --  1.9 1.8  --   PHOS  --  2.4* 3.2  --    GFR: Estimated Creatinine Clearance: 39.6 mL/min (by C-G formula based on SCr of 0.79 mg/dL). Liver Function  Tests: Recent Labs  Lab 08/03/21 0421  AST 49*  ALT 21  ALKPHOS 68  BILITOT 0.9  PROT 5.9*  ALBUMIN 3.3*   No results for input(s): LIPASE, AMYLASE in the last 168 hours. No results for input(s): AMMONIA in the last 168 hours. Coagulation Profile: No results for input(s): INR, PROTIME in the last 168 hours. Cardiac Enzymes: No results for input(s): CKTOTAL, CKMB, CKMBINDEX, TROPONINI in the last 168 hours.  BNP (last 3 results) No results for input(s): PROBNP in the last 8760 hours. HbA1C: No results for input(s): HGBA1C in the last 72 hours. CBG: No results for input(s): GLUCAP in the last 168 hours. Lipid Profile: No results for input(s): CHOL, HDL, LDLCALC, TRIG, CHOLHDL, LDLDIRECT in the last 72 hours. Thyroid Function Tests: No results for input(s): TSH, T4TOTAL, FREET4, T3FREE, THYROIDAB in the last 72 hours.  Anemia Panel: No results for input(s): VITAMINB12, FOLATE, FERRITIN, TIBC, IRON, RETICCTPCT in the last 72 hours. Sepsis Labs: Recent Labs  Lab 08/02/21 1719  LATICACIDVEN 1.0    Recent Results (from the past 240 hour(s))  Urine Culture     Status: Abnormal   Collection Time: 08/02/21 12:16 PM   Specimen: Urine, Clean Catch  Result Value Ref Range Status   Specimen Description   Final    URINE, CLEAN CATCH Performed at [facility], [place] [address], [place], [place] [address]    Special Requests   Final    NONE Performed at [facility], [place] [address], [place], [place] [address]    Culture MULTIPLE SPECIES PRESENT, SUGGEST RECOLLECTION (A)  Final   Report Status 08/03/2021 FINAL  Final  Resp Panel by RT-PCR (Flu A&B, Covid) Nasopharyngeal Swab     Status: Abnormal   Collection Time: 08/02/21  5:19 PM   Specimen: Nasopharyngeal Swab; Nasopharyngeal(NP) swabs in vial transport medium  Result Value Ref Range Status   SARS Coronavirus 2 by RT PCR POSITIVE (A) NEGATIVE Final    Comment: CRITICAL RESULT CALLED TO,  READ BACK BY AND VERIFIED WITH:  [place] CUMMINGS RN 08/03/21 @ 0315 VS (NOTE) SARS-CoV-2 target nucleic acids are DETECTED.  The SARS-CoV-2 RNA is generally detectable in upper respiratory specimens during the acute phase of infection. Positive results are indicative of the presence of the identified virus, but do not rule out bacterial infection or co-infection with other pathogens not detected by the test. Clinical correlation with patient history and other diagnostic information is necessary to determine patient infection status. The expected result is Negative.  Fact Sheet for Patients: EntrepreneurPulse.com.au  Fact Sheet for Healthcare Providers: IncredibleEmployment.be  This test is not yet approved or cleared by the [place] FDA and  has been authorized for detection and/or diagnosis of SARS-CoV-2 by FDA under an Emergency Use Authorization (EUA).  This EUA will remain in effect (meaning this  test can be used) for the duration of  the COVID-19 declaration under Section 564(b)(1) of the Act, 21 U.S.C. section 360bbb-3(b)(1), unless the authorization is terminated or revoked sooner.     Influenza A by PCR NEGATIVE NEGATIVE Final   Influenza B by PCR NEGATIVE NEGATIVE Final    Comment: (NOTE) The Xpert Xpress SARS-CoV-2/FLU/RSV plus assay is intended as an aid in the diagnosis of influenza from Nasopharyngeal swab specimens and should not be used as a sole basis for treatment. Nasal washings and aspirates are unacceptable for Xpert Xpress SARS-CoV-2/FLU/RSV testing.  Fact Sheet for Patients: EntrepreneurPulse.com.au  Fact Sheet for Healthcare Providers: IncredibleEmployment.be  This test is not yet approved or cleared by the [place] FDA and has been authorized for detection and/or diagnosis of SARS-CoV-2 by FDA under an Emergency Use Authorization (EUA). This EUA will remain in effect  (meaning this test can be used) for the duration of the COVID-19 declaration under Section 564(b)(1) of the Act, 21 U.S.C. section 360bbb-3(b)(1), unless the authorization is terminated or revoked.  Performed at [facility], [place] [address], [place], [place] [address]     Radiology Studies: No results found.  Scheduled Meds:  docusate sodium  100 mg Oral BID   enoxaparin (LOVENOX) injection  40 mg Subcutaneous Q24H   QUEtiapine  12.5 mg Oral BID   Continuous Infusions:     LOS: 6 days    Time spent: 25 mins    Kayleen Memos, MD [facility]  If 7PM-7AM, please contact night-coverage

## 2021-08-10 LAB — BASIC METABOLIC PANEL
Anion gap: 8 (ref 5–15)
BUN: 24 mg/dL — ABNORMAL HIGH (ref 8–23)
CO2: 23 mmol/L (ref 22–32)
Calcium: 8.8 mg/dL — ABNORMAL LOW (ref 8.9–10.3)
Chloride: 104 mmol/L (ref 98–111)
Creatinine, Ser: 0.74 mg/dL (ref 0.44–1.00)
GFR, Estimated: >60 mL/min (ref >60)
Glucose, Bld: 92 mg/dL (ref 70–99)
Potassium: 3.6 mmol/L (ref 3.5–5.1)
Sodium: 135 mmol/L (ref 135–145)

## 2021-08-10 NOTE — Plan of Care (Signed)
°  Problem: Clinical Measurements: Goal: Ability to maintain clinical measurements within normal limits will improve Outcome: Progressing Goal: Will remain free from infection Outcome: Progressing Goal: Diagnostic test results will improve Outcome: Progressing Goal: Respiratory complications will improve Outcome: Progressing Goal: Cardiovascular complication will be avoided Outcome: Progressing   Problem: Elimination: Goal: Will not experience complications related to bowel motility Outcome: Progressing   Problem: Pain Managment: Goal: General experience of comfort will improve Outcome: Progressing   Problem: Safety: Goal: Ability to remain free from injury will improve Outcome: Progressing   Problem: Skin Integrity: Goal: Risk for impaired skin integrity will decrease Outcome: Progressing   Problem: Activity: Goal: Risk for activity intolerance will decrease Outcome: Not Progressing   Problem: Nutrition: Goal: Adequate nutrition will be maintained Outcome: Not Progressing   Problem: Coping: Goal: Level of anxiety will decrease Outcome: Not Progressing   Problem: Elimination: Goal: Will not experience complications related to urinary retention Outcome: Not Progressing

## 2021-08-10 NOTE — Plan of Care (Signed)
°  Problem: Clinical Measurements: Goal: Ability to maintain clinical measurements within normal limits will improve Outcome: Progressing Goal: Will remain free from infection Outcome: Progressing Goal: Diagnostic test results will improve Outcome: Progressing Goal: Respiratory complications will improve Outcome: Progressing Goal: Cardiovascular complication will be avoided Outcome: Progressing   Problem: Activity: Goal: Risk for activity intolerance will decrease Outcome: Progressing   Problem: Nutrition: Goal: Adequate nutrition will be maintained Outcome: Progressing   Problem: Coping: Goal: Level of anxiety will decrease Outcome: Progressing   Problem: Elimination: Goal: Will not experience complications related to bowel motility Outcome: Progressing Goal: Will not experience complications related to urinary retention Outcome: Progressing   Problem: Pain Managment: Goal: General experience of comfort will improve Outcome: Progressing   Problem: Safety: Goal: Ability to remain free from injury will improve Outcome: Progressing   Problem: Skin Integrity: Goal: Risk for impaired skin integrity will decrease Outcome: Progressing   Problem: Education: Goal: Knowledge of risk factors and measures for prevention of condition will improve Outcome: Progressing   Problem: Coping: Goal: Psychosocial and spiritual needs will be supported Outcome: Progressing   Problem: Respiratory: Goal: Will maintain a patent airway Outcome: Progressing Goal: Complications related to the disease process, condition or treatment will be avoided or minimized Outcome: Progressing

## 2021-08-10 NOTE — Progress Notes (Signed)
PROGRESS NOTE    Sophia Hancock  IRS:[id number] DOB: 05-14-1928 DOA: 08/02/2021 PCP: Curly Rim, MD   Brief Narrative:  87 years old female with PMH significant for dementia, hypertension, hearing impairment, arthritis, history of SVT brought in by EMS from home with altered mental status.  Patient was found under the bed by her family not sure how long she was under the bed.  Assuming she might have fallen from bed and remained there throughout the night.  Patient has acquired COVID infection 2 days ago from [place],  her symptoms are very mild,  daughter reports having runny nose and slight congestion but denies any cough or shortness of breath. Patient admitted for acute confusion thought secondary to UTI and COVID infection.  She has completed antibiotics for UTI.  PT recommended  skilled nursing facility.  She is medically clear , since she is COVID+, She will need to stay in hospital 10 days before she can be discharged to SNF.  Last day of isolation 08/12/2021.  Can be accepted to SNF on 08/13/2021.  08/10/21: Seen and examined at bedside.  She is pleasantly demented.  She has no new complaints.  Appetite is improving.  She worked with OT.  Also sat in a chair.  Assessment & Plan:   Principal Problem:   Altered mental status Active Problems:   Hypertension   SVT (supraventricular tachycardia) (HCC)   Hearing loss   Urinary incontinence   Osteoporosis   Chronic kidney disease (CKD), stage III (moderate) (HCC)  Acute metabolic encephalopathy/delirium, multifactorial: Suspect multifactorial, secondary to UTI, COVID+, Dehydration , decreased PO Patient was found on the floor, unsure how long she was on floor. CK 395.  UA consistent with UTI. Completed IV antibiotics and IV hydration Urine culture contaminated. CT head unremarkable, CT C-spine unremarkable. It appears she is now getting back to her baseline mental status. Continue delirium precaution,  nonpharmacological and pharmacological delirium precautions. Continue Seroquel 12.5 mg twice daily.  Add melatonin 3 mg nightly Continue to reorient frequently Continue one-to-one sitter Continue delirium and fall precautions.   Treated UTI: UA consistent with UTI, Completed ceftriaxone, urine culture contaminated. Patient does not meet sepsis criteria. Lactic acid normal.  Discontinue ceftriaxone,  received 3 days of ceftriaxone.   COVID+: Oxygen saturation 99% on room air. Continue airborne and droplet precautions. Continue supportive care   Fall: Suspect she might have fallen from bed and remained on the floor throughout night. Skeletal work-up completely unremarkable. CK slightly elevated, received gentle IV hydration. PT and OT recommended skilled nursing facility.   Essential hypertension: BP is at goal Continue to hold hydrochlorothiazide to avoid hypotension, Maintain MAP greater than 65.  Delirium in the setting of dementia: Continue delirium precautions. Continue Seroquel 12.5 mg twice daily. Continue melatonin 3 mg nightly. Continue delirium precautions Continue fall precautions Continue to reorient frequently   DVT prophylaxis:Lovenox subcu daily. Code Status: DNR Family Communication: Updated her daughter via phone on 01/07/2021. Disposition Plan:     Status is: Inpatient  Remains inpatient appropriate because: inpatient  Altered mental status could be secondary to UTI and COVID infection.    Patient is medically clear for discharge: Yes  Patient needs SNF placement, severely deconditioned.  She is COVID-positive on 12/82022.  She can be accepted in  nursing home on 08/13/2021.   Consultants:  None  Procedures: CT head and C-spine.  Antimicrobials:  Anti-infectives (From admission, onward)    Start     Dose/Rate Route Frequency Ordered  Stop   08/03/21 1400  cefTRIAXone (ROCEPHIN) 1 g in sodium chloride 0.9 % 100 mL IVPB  Status:   Discontinued        1 g 200 mL/hr over 30 Minutes Intravenous Every 24 hours 08/02/21 1607 08/06/21 0801   08/02/21 1315  cefTRIAXone (ROCEPHIN) 1 g in sodium chloride 0.9 % 100 mL IVPB        1 g 200 mL/hr over 30 Minutes Intravenous  Once 08/02/21 1303 08/02/21 1348         Objective: Vitals:   08/09/21 1300 08/09/21 1933 08/10/21 0355 08/10/21 1404  BP: 128/80 133/86 (!) 151/79 (!) 153/89  Pulse: 82 83 78 93  Resp: 15 16 18 18   Temp: 98.1 F (36.7 C) 98.4 F (36.9 C) 98 F (36.7 C) 97.6 F (36.4 C)  TempSrc: Oral Oral Axillary Oral  SpO2: 100% 100% 100% 98%  Weight:      Height:        Intake/Output Summary (Last 24 hours) at 08/10/2021 1604 Last data filed at 08/10/2021 0700 Gross per 24 hour  Intake 240 ml  Output --  Net 240 ml   Filed Weights   08/02/21 1143 08/02/21 2118  Weight: 61.2 kg 57.4 kg    Examination: No significant changes from prior exam.  General exam: Frail-appearing no acute distress.  She is alert and pleasantly confused.   Respiratory system: Clear to auscultation with no wheezes or rales.   Cardiovascular system: Regular rate and rhythm no rubs or gallops.   Gastrointestinal system: Soft nontender normal bowel sounds present.  Nondistended.   Central nervous system: Alert and pleasantly confused.  Nonfocal.  Extremities: No lower extremity edema bilaterally. Skin: No rashes or ulcerative lesions. Psychiatry: Mood is appropriate for condition and setting.    Data Reviewed: I have personally reviewed following labs and imaging studies  CBC: Recent Labs  Lab 08/08/21 0358  WBC 7.4  HGB 13.1  HCT 38.8  MCV 95.8  PLT 509   Basic Metabolic Panel: Recent Labs  Lab 08/08/21 0358 08/09/21 0346 08/10/21 0615  NA 137  --  135  K 3.0*  --  3.6  CL 102  --  104  CO2 25  --  23  GLUCOSE 97  --  92  BUN 23  --  24*  CREATININE 0.68 0.79 0.74  CALCIUM 8.4*  --  8.8*  MG 1.8  --   --   PHOS 3.2  --   --    GFR: Estimated  Creatinine Clearance: 39.6 mL/min (by C-G formula based on SCr of 0.74 mg/dL). Liver Function Tests: No results for input(s): AST, ALT, ALKPHOS, BILITOT, PROT, ALBUMIN in the last 168 hours.  No results for input(s): LIPASE, AMYLASE in the last 168 hours. No results for input(s): AMMONIA in the last 168 hours. Coagulation Profile: No results for input(s): INR, PROTIME in the last 168 hours. Cardiac Enzymes: No results for input(s): CKTOTAL, CKMB, CKMBINDEX, TROPONINI in the last 168 hours.  BNP (last 3 results) No results for input(s): PROBNP in the last 8760 hours. HbA1C: No results for input(s): HGBA1C in the last 72 hours. CBG: No results for input(s): GLUCAP in the last 168 hours. Lipid Profile: No results for input(s): CHOL, HDL, LDLCALC, TRIG, CHOLHDL, LDLDIRECT in the last 72 hours. Thyroid Function Tests: No results for input(s): TSH, T4TOTAL, FREET4, T3FREE, THYROIDAB in the last 72 hours. Anemia Panel: No results for input(s): VITAMINB12, FOLATE, FERRITIN, TIBC, IRON, RETICCTPCT in the  last 72 hours. Sepsis Labs: No results for input(s): PROCALCITON, LATICACIDVEN in the last 168 hours.   Recent Results (from the past 240 hour(s))  Urine Culture     Status: Abnormal   Collection Time: 08/02/21 12:16 PM   Specimen: Urine, Clean Catch  Result Value Ref Range Status   Specimen Description   Final    URINE, CLEAN CATCH Performed at [facility], [place] [address] [place], [place] [address]    Special Requests   Final    NONE Performed at [facility], [place] [address], [place], [place] [address]    Culture MULTIPLE SPECIES PRESENT, SUGGEST RECOLLECTION (A)  Final   Report Status 08/03/2021 FINAL  Final  Resp Panel by RT-PCR (Flu A&B, Covid) Nasopharyngeal Swab     Status: Abnormal   Collection Time: 08/02/21  5:19 PM   Specimen: Nasopharyngeal Swab; Nasopharyngeal(NP) swabs in vial transport medium  Result Value Ref Range Status    SARS Coronavirus 2 by RT PCR POSITIVE (A) NEGATIVE Final    Comment: CRITICAL RESULT CALLED TO, READ BACK BY AND VERIFIED WITH:  [place] CUMMINGS RN 08/03/21 @ 0315 VS (NOTE) SARS-CoV-2 target nucleic acids are DETECTED.  The SARS-CoV-2 RNA is generally detectable in upper respiratory specimens during the acute phase of infection. Positive results are indicative of the presence of the identified virus, but do not rule out bacterial infection or co-infection with other pathogens not detected by the test. Clinical correlation with patient history and other diagnostic information is necessary to determine patient infection status. The expected result is Negative.  Fact Sheet for Patients: EntrepreneurPulse.com.au  Fact Sheet for Healthcare Providers: IncredibleEmployment.be  This test is not yet approved or cleared by the [place] FDA and  has been authorized for detection and/or diagnosis of SARS-CoV-2 by FDA under an Emergency Use Authorization (EUA).  This EUA will remain in effect (meaning this  test can be used) for the duration of  the COVID-19 declaration under Section 564(b)(1) of the Act, 21 U.S.C. section 360bbb-3(b)(1), unless the authorization is terminated or revoked sooner.     Influenza A by PCR NEGATIVE NEGATIVE Final   Influenza B by PCR NEGATIVE NEGATIVE Final    Comment: (NOTE) The Xpert Xpress SARS-CoV-2/FLU/RSV plus assay is intended as an aid in the diagnosis of influenza from Nasopharyngeal swab specimens and should not be used as a sole basis for treatment. Nasal washings and aspirates are unacceptable for Xpert Xpress SARS-CoV-2/FLU/RSV testing.  Fact Sheet for Patients: EntrepreneurPulse.com.au  Fact Sheet for Healthcare Providers: IncredibleEmployment.be  This test is not yet approved or cleared by the [place] FDA and has been authorized for detection and/or diagnosis of  SARS-CoV-2 by FDA under an Emergency Use Authorization (EUA). This EUA will remain in effect (meaning this test can be used) for the duration of the COVID-19 declaration under Section 564(b)(1) of the Act, 21 U.S.C. section 360bbb-3(b)(1), unless the authorization is terminated or revoked.  Performed at [facility], [place] [address] [place],  [address]     Radiology Studies: No results found.  Scheduled Meds:  docusate sodium  100 mg Oral BID   enoxaparin (LOVENOX) injection  40 mg Subcutaneous Q24H   melatonin  3 mg Oral QHS   QUEtiapine  12.5 mg Oral BID   Continuous Infusions:     LOS: 7 days    Time spent: 25 mins    Kayleen Memos, MD [facility]   If 7PM-7AM, please  contact night-coverage

## 2021-08-10 NOTE — Plan of Care (Signed)

## 2021-08-10 NOTE — Progress Notes (Signed)
Patient very restless overnight, difficult to reorient and climbing out of bed repeatedly.   Only intermittently following commands. Sitter maintained patient safety and helped to redirect.

## 2021-08-10 NOTE — Progress Notes (Signed)
Occupational Therapy Treatment Patient Details Name: Sophia Hancock MRN: [id number] DOB: [birth year] Today's Date: 08/10/2021   History of present illness 87yo female who presented on 08/02/21 with AMS; she fell OOB and was found under the bed by family, who also reports she has been hallucinating recently. Imaging negative for fracture, but Covid +. Admitted with UTI, covid, and hx of fall at home. PMH dementia, HTN, HOH, SVT   OT comments  Patient continues to be limited by severity of confusion. She requires a Actuary. She is restless and only alert to self. When asked questions 75% of the time her response is inappropriate. She is able to follow tactile commands to transfer to side of bed (mod assist), stand and ambulate in room with RW. Her balance is impaired needing min assist to steady. Her impaired cognition limits her ability to perform functional tasks or maintain attention to any task. Attempted towel folding in seated position - she was able to initiate task but unable to complete one fold. Continue to recommend to improve her functional abilities in order to reduce caregiver burden.    Recommendations for follow up therapy are one component of a multi-disciplinary discharge planning process, led by the attending physician.  Recommendations may be updated based on patient status, additional functional criteria and insurance authorization.    Follow Up Recommendations  Skilled nursing-short term rehab (<3 hours/day)    Assistance Recommended at Discharge Frequent or constant Supervision/Assistance  Equipment Recommendations  BSC/3in1;Hospital bed;Wheelchair (measurements OT)    Recommendations for Other Services      Precautions / Restrictions Precautions Precautions: Fall Precaution Comments: Covid +, HOH Restrictions Weight Bearing Restrictions: No       Mobility Bed Mobility Overal bed mobility: Needs Assistance Bed Mobility: Supine to Sit     Supine to sit: Mod  assist     General bed mobility comments: Patient restless and wanting to get out of bed. Mod assist to transfer to edge of bed.    Transfers Overall transfer level: Needs assistance Equipment used: Rolling walker (2 wheels) Transfers: Sit to/from Stand Sit to Stand: Min assist           General transfer comment: MIn assist to stand, min assist to steady with ambulation in room with RW. One major LOB that would have resulted in a fall if therapist hadn't been there. has a tendency to loss balance to the right. Min Assistance to manage walker on turns.     Balance Overall balance assessment: Needs assistance Sitting-balance support: No upper extremity supported Sitting balance-Leahy Scale: Fair     Standing balance support: Bilateral upper extremity supported Standing balance-Leahy Scale: Poor Standing balance comment: reliant on therapist to maintain balance                             Vision Baseline Vision/History: 1 Wears glasses Vision Assessment?: No apparent visual deficits          Cognition Arousal/Alertness: Awake/alert Behavior During Therapy: Restless Overall Cognitive Status: Impaired/Different from baseline Area of Impairment: Orientation;Attention;Following commands;Safety/judgement;Awareness;Problem solving                 Orientation Level: Disoriented to;Place;Time;Situation Current Attention Level: Focused   Following Commands: Follows one step commands inconsistently Safety/Judgement: Decreased awareness of safety;Decreased awareness of deficits Awareness: Intellectual Problem Solving: Difficulty sequencing;Requires verbal cues;Requires tactile cues;Slow processing            Exercises Other Exercises Other  Exercises: attempted to perform functonal task - folding towels while seated in recliner.      General Comments      Pertinent Vitals/ Pain       Pain Assessment: Faces Faces Pain Scale: No hurt   Frequency  Min  2X/week        Progress Toward Goals  OT Goals(current goals can now be found in the care plan section)  Progress towards OT goals: Not progressing toward goals - comment (continues to be limited by impaired cognition)  Acute Rehab OT Goals OT Goal Formulation: Patient unable to participate in goal setting Time For Goal Achievement: 08/17/21 Potential to Achieve Goals: [place] Discharge plan remains appropriate    Co-evaluation                 AM-PAC OT "6 Clicks" Daily Activity     Outcome Measure   Help from another person eating meals?: A Lot Help from another person taking care of personal grooming?: Total Help from another person toileting, which includes using toliet, bedpan, or urinal?: Total Help from another person bathing (including washing, rinsing, drying)?: Total Help from another person to put on and taking off regular upper body clothing?: Total Help from another person to put on and taking off regular lower body clothing?: Total 6 Click Score: 7    End of Session Equipment Utilized During Treatment: Gait belt;Rolling walker (2 wheels)  OT Visit Diagnosis: Unsteadiness on feet (R26.81);Other abnormalities of gait and mobility (R26.89);Muscle weakness (generalized) (M62.81);Other symptoms and signs involving cognitive function   Activity Tolerance Patient tolerated treatment well   Patient Left in chair;with nursing/sitter in room   Nurse Communication Mobility status (okay to see)        Time: 6433-2951 OT Time Calculation (min): 13 min  Charges: OT General Charges $OT Visit: 1 Visit OT Treatments $Therapeutic Activity: 8-22 mins  Derl Barrow, OTR/L [place]  Office [phone] Pager: [place] 08/10/2021, 10:57 AM

## 2021-08-11 MED ORDER — QUETIAPINE FUMARATE 25 MG PO TABS
12.5000 mg | ORAL_TABLET | Freq: Once | ORAL | Status: AC
Start: 2021-08-11 — End: 2021-08-11
  Administered 2021-08-11: 12.5 mg via ORAL
  Filled 2021-08-11: qty 1

## 2021-08-11 NOTE — Progress Notes (Signed)
PROGRESS NOTE    Sophia Hancock  SHF:[id number] DOB: February 08, 1928 DOA: 08/02/2021 PCP: Curly Rim, MD   Brief Narrative:  87 years old female with PMH significant for dementia, hypertension, hearing impairment, arthritis, history of SVT brought in by EMS from home with altered mental status.  Patient was found under the bed by her family not sure how long she was under the bed.  Assuming she might have fallen from bed and remained there throughout the night.  Patient has acquired COVID infection 2 days ago from [place],  her symptoms are very mild,  daughter reports having runny nose and slight congestion but denies any cough or shortness of breath. Patient admitted for acute confusion thought secondary to UTI and COVID infection.  She has completed antibiotics for UTI.  PT recommended  skilled nursing facility.  She is medically clear , since she is COVID+, She will need to stay in hospital 10 days before she can be discharged to SNF.  Last day of isolation 08/12/2021.  Can be accepted to SNF on 08/13/2021.  08/11/21: Seen at her bedside.  She was not able to get any sleep last night.  One-to-one sitter in the room.  Trazodone added for sleep nightly.  Assessment & Plan:   Principal Problem:   Altered mental status Active Problems:   Hypertension   SVT (supraventricular tachycardia) (HCC)   Hearing loss   Urinary incontinence   Osteoporosis   Chronic kidney disease (CKD), stage III (moderate) (HCC)   Acute metabolic encephalopathy/delirium, multifactorial: Suspect multifactorial, secondary to UTI, COVID+, Dehydration , decreased PO Patient was found on the floor, unsure how long she was on floor. CK 395.  UA consistent with UTI. Completed IV antibiotics and IV hydration Urine culture contaminated. CT head unremarkable, CT C-spine unremarkable. It appears she is now getting back to her baseline mental status. Continue delirium precaution, nonpharmacological and  pharmacological delirium precautions. Continue Seroquel 12.5 mg twice daily.  Add melatonin 3 mg nightly Continue to reorient frequently Continue one-to-one sitter Continue delirium and fall precautions.   Treated UTI: UA consistent with UTI, Completed ceftriaxone, urine culture contaminated. Patient does not meet sepsis criteria. Lactic acid normal.  Discontinue ceftriaxone,  received 3 days of ceftriaxone.   COVID+: Oxygen saturation 99% on room air. Continue airborne and droplet precautions. Continue supportive care   Fall: Suspect she might have fallen from bed and remained on the floor throughout night. Skeletal work-up completely unremarkable. CK slightly elevated, received gentle IV hydration. PT and OT recommended skilled nursing facility.   Essential hypertension: BP is at goal Continue to hold hydrochlorothiazide to avoid hypotension, Maintain MAP greater than 65.  Delirium in the setting of dementia: Continue delirium precautions. Continue Seroquel 12.5 mg twice daily. Continue melatonin 3 mg nightly. Continue delirium precautions Continue fall precautions Continue to reorient frequently  Situational insomnia Continue Seroquel twice daily Add trazodone nightly   DVT prophylaxis:Lovenox subcu daily. Code Status: DNR Family Communication: Updated her daughter via phone on 01/07/2021. Disposition Plan:     Status is: Inpatient  Remains inpatient appropriate because: inpatient  Altered mental status could be secondary to UTI and COVID infection.    Patient is medically clear for discharge: Yes  Patient needs SNF placement, severely deconditioned.  She is COVID-positive on 12/82022.  She can be accepted in  nursing home on 08/13/2021.   Consultants:  None  Procedures: CT head and C-spine.  Antimicrobials:  Anti-infectives (From admission, onward)    Start  Dose/Rate Route Frequency Ordered Stop   08/03/21 1400  cefTRIAXone (ROCEPHIN) 1 g  in sodium chloride 0.9 % 100 mL IVPB  Status:  Discontinued        1 g 200 mL/hr over 30 Minutes Intravenous Every 24 hours 08/02/21 1607 08/06/21 0801   08/02/21 1315  cefTRIAXone (ROCEPHIN) 1 g in sodium chloride 0.9 % 100 mL IVPB        1 g 200 mL/hr over 30 Minutes Intravenous  Once 08/02/21 1303 08/02/21 1348         Objective: Vitals:   08/10/21 1919 08/10/21 2107 08/11/21 0655 08/11/21 1229  BP: 130/84 (!) 121/53 (!) 114/95 130/79  Pulse: 83 87 96 94  Resp: 18 18 18 20   Temp: 97.8 F (36.6 C) 98.4 F (36.9 C) 97.9 F (36.6 C) 98.1 F (36.7 C)  TempSrc: Axillary Axillary Axillary Oral  SpO2: 99% 100% 99% 99%  Weight:      Height:        Intake/Output Summary (Last 24 hours) at 08/11/2021 1240 Last data filed at 08/11/2021 0943 Gross per 24 hour  Intake 580 ml  Output --  Net 580 ml   Filed Weights   08/02/21 1143 08/02/21 2118  Weight: 61.2 kg 57.4 kg    Examination:  General exam: Frail-appearing no acute distress she is alert and confused.   Respiratory system: Clear takotsubo no wheezes or rales  cardiovascular system: Regular rate and rhythm  gastrointestinal system: Soft nontender, bowel sounds present.   Central nervous system: Alert and pleasantly confused.  Nonfocal.   Extremities: No lower extremity edema bilaterally Skin: No rashes or ulcerative lesions. Psychiatry: Mood is appropriate for condition and setting   Data Reviewed: I have personally reviewed following labs and imaging studies  CBC: Recent Labs  Lab 08/08/21 0358  WBC 7.4  HGB 13.1  HCT 38.8  MCV 95.8  PLT 332   Basic Metabolic Panel: Recent Labs  Lab 08/08/21 0358 08/09/21 0346 08/10/21 0615  NA 137  --  135  K 3.0*  --  3.6  CL 102  --  104  CO2 25  --  23  GLUCOSE 97  --  92  BUN 23  --  24*  CREATININE 0.68 0.79 0.74  CALCIUM 8.4*  --  8.8*  MG 1.8  --   --   PHOS 3.2  --   --    GFR: Estimated Creatinine Clearance: 39.6 mL/min (by C-G formula based on  SCr of 0.74 mg/dL). Liver Function Tests: No results for input(s): AST, ALT, ALKPHOS, BILITOT, PROT, ALBUMIN in the last 168 hours.  No results for input(s): LIPASE, AMYLASE in the last 168 hours. No results for input(s): AMMONIA in the last 168 hours. Coagulation Profile: No results for input(s): INR, PROTIME in the last 168 hours. Cardiac Enzymes: No results for input(s): CKTOTAL, CKMB, CKMBINDEX, TROPONINI in the last 168 hours.  BNP (last 3 results) No results for input(s): PROBNP in the last 8760 hours. HbA1C: No results for input(s): HGBA1C in the last 72 hours. CBG: No results for input(s): GLUCAP in the last 168 hours. Lipid Profile: No results for input(s): CHOL, HDL, LDLCALC, TRIG, CHOLHDL, LDLDIRECT in the last 72 hours. Thyroid Function Tests: No results for input(s): TSH, T4TOTAL, FREET4, T3FREE, THYROIDAB in the last 72 hours. Anemia Panel: No results for input(s): VITAMINB12, FOLATE, FERRITIN, TIBC, IRON, RETICCTPCT in the last 72 hours. Sepsis Labs: No results for input(s): PROCALCITON, LATICACIDVEN in the last 168  hours.   Recent Results (from the past 240 hour(s))  Urine Culture     Status: Abnormal   Collection Time: 08/02/21 12:16 PM   Specimen: Urine, Clean Catch  Result Value Ref Range Status   Specimen Description   Final    URINE, CLEAN CATCH Performed at [facility], [place] [address], [place], [place] [address]    Special Requests   Final    NONE Performed at [facility], [place] [address] [place], [place] [address]    Culture MULTIPLE SPECIES PRESENT, SUGGEST RECOLLECTION (A)  Final   Report Status 08/03/2021 FINAL  Final  Resp Panel by RT-PCR (Flu A&B, Covid) Nasopharyngeal Swab     Status: Abnormal   Collection Time: 08/02/21  5:19 PM   Specimen: Nasopharyngeal Swab; Nasopharyngeal(NP) swabs in vial transport medium  Result Value Ref Range Status   SARS Coronavirus 2 by RT PCR POSITIVE (A) NEGATIVE Final     Comment: CRITICAL RESULT CALLED TO, READ BACK BY AND VERIFIED WITH:  [place] CUMMINGS RN 08/03/21 @ 0315 VS (NOTE) SARS-CoV-2 target nucleic acids are DETECTED.  The SARS-CoV-2 RNA is generally detectable in upper respiratory specimens during the acute phase of infection. Positive results are indicative of the presence of the identified virus, but do not rule out bacterial infection or co-infection with other pathogens not detected by the test. Clinical correlation with patient history and other diagnostic information is necessary to determine patient infection status. The expected result is Negative.  Fact Sheet for Patients: EntrepreneurPulse.com.au  Fact Sheet for Healthcare Providers: IncredibleEmployment.be  This test is not yet approved or cleared by the [place] FDA and  has been authorized for detection and/or diagnosis of SARS-CoV-2 by FDA under an Emergency Use Authorization (EUA).  This EUA will remain in effect (meaning this  test can be used) for the duration of  the COVID-19 declaration under Section 564(b)(1) of the Act, 21 U.S.C. section 360bbb-3(b)(1), unless the authorization is terminated or revoked sooner.     Influenza A by PCR NEGATIVE NEGATIVE Final   Influenza B by PCR NEGATIVE NEGATIVE Final    Comment: (NOTE) The Xpert Xpress SARS-CoV-2/FLU/RSV plus assay is intended as an aid in the diagnosis of influenza from Nasopharyngeal swab specimens and should not be used as a sole basis for treatment. Nasal washings and aspirates are unacceptable for Xpert Xpress SARS-CoV-2/FLU/RSV testing.  Fact Sheet for Patients: EntrepreneurPulse.com.au  Fact Sheet for Healthcare Providers: IncredibleEmployment.be  This test is not yet approved or cleared by the [place] FDA and has been authorized for detection and/or diagnosis of SARS-CoV-2 by FDA under an Emergency Use Authorization  (EUA). This EUA will remain in effect (meaning this test can be used) for the duration of the COVID-19 declaration under Section 564(b)(1) of the Act, 21 U.S.C. section 360bbb-3(b)(1), unless the authorization is terminated or revoked.  Performed at [facility], [place] [address], [place], [place] [address]     Radiology Studies: No results found.  Scheduled Meds:  docusate sodium  100 mg Oral BID   enoxaparin (LOVENOX) injection  40 mg Subcutaneous Q24H   melatonin  3 mg Oral QHS   QUEtiapine  12.5 mg Oral BID   Continuous Infusions:     LOS: 8 days    Time spent: 25 mins    Kayleen Memos, MD [facility]   If 7PM-7AM, please contact night-coverage

## 2021-08-11 NOTE — Progress Notes (Signed)
Pt keeps trying to impulsively get out of bed even after redirection. MD notified and new order placed.

## 2021-08-12 MED ORDER — TRAZODONE HCL 50 MG PO TABS
50.0000 mg | ORAL_TABLET | Freq: Every day | ORAL | Status: DC
  Administered 2021-08-12 – 2021-08-14 (×3): 50 mg via ORAL
  Filled 2021-08-12 (×3): qty 1

## 2021-08-12 NOTE — Progress Notes (Signed)
PROGRESS NOTE    Sophia Hancock  HUD:[id number] DOB: Sep 04, 1927 DOA: 08/02/2021 PCP: Curly Rim, MD   Brief Narrative:  87 years old female with PMH significant for dementia, hypertension, hearing impairment, arthritis, history of SVT brought in by EMS from home with altered mental status.  Patient was found under the bed by her family not sure how long she was under the bed.  Assuming she might have fallen from bed and remained there throughout the night.  Patient has acquired COVID infection 2 days ago from [place],  her symptoms are very mild,  daughter reports having runny nose and slight congestion but denies any cough or shortness of breath. Patient admitted for acute confusion thought secondary to UTI and COVID infection.  She has completed antibiotics for UTI.  PT recommended  skilled nursing facility.  She is medically clear , since she is COVID+, She will need to stay in hospital 10 days before she can be discharged to SNF.  Last day of isolation 08/12/2021.  Can be accepted to SNF on 08/13/2021.  08/12/21: Seen at her bedside.  Delirium overnight.  Very restless this morning.  Seroquel dose increased to 25 twice daily.  Trazodone added nightly.  Assessment & Plan:   Principal Problem:   Altered mental status Active Problems:   Hypertension   SVT (supraventricular tachycardia) (HCC)   Hearing loss   Urinary incontinence   Osteoporosis   Chronic kidney disease (CKD), stage III (moderate) (HCC)   Acute metabolic encephalopathy/delirium, multifactorial: Suspect multifactorial, secondary to UTI, COVID+, Dehydration , decreased PO Patient was found on the floor, unsure how long she was on floor. CK 395.  UA consistent with UTI. Completed IV antibiotics and IV hydration Urine culture contaminated. CT head unremarkable, CT C-spine unremarkable. It appears she is now getting back to her baseline mental status. Continue delirium precaution, nonpharmacological and  pharmacological delirium precautions. Dose increased, Seroquel 25 mg twice daily, trazodone 50 mg nightly added. Continue to reorient frequently Continue one-to-one sitter Continue delirium and fall precautions.   Treated UTI: UA consistent with UTI, Completed ceftriaxone, urine culture contaminated. Patient does not meet sepsis criteria. Lactic acid normal.  Discontinue ceftriaxone,  received 3 days of ceftriaxone.   COVID+: Oxygen saturation 99% on room air. Continue airborne and droplet precautions. Continue supportive care   Fall: Suspect she might have fallen from bed and remained on the floor throughout night. Skeletal work-up completely unremarkable. CK slightly elevated, received gentle IV hydration. PT and OT recommended skilled nursing facility.   Essential hypertension: BP is at goal Continue to hold hydrochlorothiazide to avoid hypotension, Maintain MAP greater than 65.  Delirium in the setting of dementia: Continue delirium precautions. Continue Seroquel 12.5 mg twice daily. Continue melatonin 3 mg nightly. Continue delirium precautions Continue fall precautions Continue to reorient frequently  Situational insomnia Trazodone 50 mg nightly added   DVT prophylaxis:Lovenox subcu daily. Code Status: DNR Family Communication: Updated her daughter via phone on 08/09/2021. Disposition Plan:     Status is: Inpatient  Remains inpatient appropriate because: inpatient  Altered mental status could be secondary to UTI and COVID infection.    Patient is medically clear for discharge: Yes  Patient needs SNF placement, severely deconditioned.  She is COVID-positive on 12/82022.  She can be accepted in  nursing home on 08/13/2021.   Consultants:  None  Procedures: CT head and C-spine.  Antimicrobials:  Anti-infectives (From admission, onward)    Start     Dose/Rate Route  Frequency Ordered Stop   08/03/21 1400  cefTRIAXone (ROCEPHIN) 1 g in sodium  chloride 0.9 % 100 mL IVPB  Status:  Discontinued        1 g 200 mL/hr over 30 Minutes Intravenous Every 24 hours 08/02/21 1607 08/06/21 0801   08/02/21 1315  cefTRIAXone (ROCEPHIN) 1 g in sodium chloride 0.9 % 100 mL IVPB        1 g 200 mL/hr over 30 Minutes Intravenous  Once 08/02/21 1303 08/02/21 1348         Objective: Vitals:   08/11/21 1229 08/11/21 2051 08/12/21 0609 08/12/21 1245  BP: 130/79 125/88 117/66 102/61  Pulse: 94 (!) 101 88 92  Resp: 20 16 16  (!) 24  Temp: 98.1 F (36.7 C) 98.5 F (36.9 C) 98.6 F (37 C) 98.4 F (36.9 C)  TempSrc: Oral Oral Oral Oral  SpO2: 99% 99% 99% 98%  Weight:      Height:        Intake/Output Summary (Last 24 hours) at 08/12/2021 1640 Last data filed at 08/12/2021 1300 Gross per 24 hour  Intake 600 ml  Output --  Net 600 ml   Filed Weights   08/02/21 1143 08/02/21 2118  Weight: 61.2 kg 57.4 kg    Examination:  General exam: Frail-appearing no acute distress.  She is alert and restless.   Respiratory system: Clear to auscultation with no wheezes or rales. cardiovascular system: Regular rate and rhythm no rubs or gallops.   Gastrointestinal system: Soft nontender bowel sounds present.   Central nervous system: Alert and oriented moves all 4 extremities.   Extremities: No lower extremity edema bilaterally. Skin: No rashes or ulcerative lesions noted. Psychiatry: Mood is appropriate for condition and setting.   Data Reviewed: I have personally reviewed following labs and imaging studies  CBC: Recent Labs  Lab 08/08/21 0358  WBC 7.4  HGB 13.1  HCT 38.8  MCV 95.8  PLT 161   Basic Metabolic Panel: Recent Labs  Lab 08/08/21 0358 08/09/21 0346 08/10/21 0615  NA 137  --  135  K 3.0*  --  3.6  CL 102  --  104  CO2 25  --  23  GLUCOSE 97  --  92  BUN 23  --  24*  CREATININE 0.68 0.79 0.74  CALCIUM 8.4*  --  8.8*  MG 1.8  --   --   PHOS 3.2  --   --    GFR: Estimated Creatinine Clearance: 39.6 mL/min (by C-G  formula based on SCr of 0.74 mg/dL). Liver Function Tests: No results for input(s): AST, ALT, ALKPHOS, BILITOT, PROT, ALBUMIN in the last 168 hours.  No results for input(s): LIPASE, AMYLASE in the last 168 hours. No results for input(s): AMMONIA in the last 168 hours. Coagulation Profile: No results for input(s): INR, PROTIME in the last 168 hours. Cardiac Enzymes: No results for input(s): CKTOTAL, CKMB, CKMBINDEX, TROPONINI in the last 168 hours.  BNP (last 3 results) No results for input(s): PROBNP in the last 8760 hours. HbA1C: No results for input(s): HGBA1C in the last 72 hours. CBG: No results for input(s): GLUCAP in the last 168 hours. Lipid Profile: No results for input(s): CHOL, HDL, LDLCALC, TRIG, CHOLHDL, LDLDIRECT in the last 72 hours. Thyroid Function Tests: No results for input(s): TSH, T4TOTAL, FREET4, T3FREE, THYROIDAB in the last 72 hours. Anemia Panel: No results for input(s): VITAMINB12, FOLATE, FERRITIN, TIBC, IRON, RETICCTPCT in the last 72 hours. Sepsis Labs: No results for  input(s): PROCALCITON, LATICACIDVEN in the last 168 hours.   Recent Results (from the past 240 hour(s))  Resp Panel by RT-PCR (Flu A&B, Covid) Nasopharyngeal Swab     Status: Abnormal   Collection Time: 08/02/21  5:19 PM   Specimen: Nasopharyngeal Swab; Nasopharyngeal(NP) swabs in vial transport medium  Result Value Ref Range Status   SARS Coronavirus 2 by RT PCR POSITIVE (A) NEGATIVE Final    Comment: CRITICAL RESULT CALLED TO, READ BACK BY AND VERIFIED WITH:  [place] CUMMINGS RN 08/03/21 @ 0315 VS (NOTE) SARS-CoV-2 target nucleic acids are DETECTED.  The SARS-CoV-2 RNA is generally detectable in upper respiratory specimens during the acute phase of infection. Positive results are indicative of the presence of the identified virus, but do not rule out bacterial infection or co-infection with other pathogens not detected by the test. Clinical correlation with patient history and other  diagnostic information is necessary to determine patient infection status. The expected result is Negative.  Fact Sheet for Patients: EntrepreneurPulse.com.au  Fact Sheet for Healthcare Providers: IncredibleEmployment.be  This test is not yet approved or cleared by the [place] FDA and  has been authorized for detection and/or diagnosis of SARS-CoV-2 by FDA under an Emergency Use Authorization (EUA).  This EUA will remain in effect (meaning this  test can be used) for the duration of  the COVID-19 declaration under Section 564(b)(1) of the Act, 21 U.S.C. section 360bbb-3(b)(1), unless the authorization is terminated or revoked sooner.     Influenza A by PCR NEGATIVE NEGATIVE Final   Influenza B by PCR NEGATIVE NEGATIVE Final    Comment: (NOTE) The Xpert Xpress SARS-CoV-2/FLU/RSV plus assay is intended as an aid in the diagnosis of influenza from Nasopharyngeal swab specimens and should not be used as a sole basis for treatment. Nasal washings and aspirates are unacceptable for Xpert Xpress SARS-CoV-2/FLU/RSV testing.  Fact Sheet for Patients: EntrepreneurPulse.com.au  Fact Sheet for Healthcare Providers: IncredibleEmployment.be  This test is not yet approved or cleared by the [place] FDA and has been authorized for detection and/or diagnosis of SARS-CoV-2 by FDA under an Emergency Use Authorization (EUA). This EUA will remain in effect (meaning this test can be used) for the duration of the COVID-19 declaration under Section 564(b)(1) of the Act, 21 U.S.C. section 360bbb-3(b)(1), unless the authorization is terminated or revoked.  Performed at [facility], [place] [address], [place], [place] [address]     Radiology Studies: No results found.  Scheduled Meds:  docusate sodium  100 mg Oral BID   enoxaparin (LOVENOX) injection  40 mg Subcutaneous Q24H   QUEtiapine  12.5 mg  Oral BID   traZODone  50 mg Oral QHS   Continuous Infusions:     LOS: 9 days    Time spent: 25 mins    Kayleen Memos, MD [facility]   If 7PM-7AM, please contact night-coverage

## 2021-08-12 NOTE — Progress Notes (Signed)
At beginning of shift, patient was resting.  Patient has been awake and restless/fidgeting since 2330.  Daughter in room at this time. She has not tried to get out of bed.  However, she is continuously moving her hands and pulling at her blankets, gown and tele leads.  For the most part she keeps her eyes closed.

## 2021-08-12 NOTE — Progress Notes (Signed)
BP 144/118. Pt was shaking the entire time the BP was being measured and was not calm. MD notified. Will recheck pts BP when pt is calm and not shaking.

## 2021-08-12 NOTE — Plan of Care (Signed)
°  Problem: Clinical Measurements: Goal: Ability to maintain clinical measurements within normal limits will improve Outcome: Progressing Goal: Will remain free from infection Outcome: Progressing Goal: Diagnostic test results will improve Outcome: Progressing Goal: Respiratory complications will improve Outcome: Progressing Goal: Cardiovascular complication will be avoided Outcome: Progressing   Problem: Activity: Goal: Risk for activity intolerance will decrease Outcome: Progressing   Problem: Nutrition: Goal: Adequate nutrition will be maintained Outcome: Progressing   Problem: Coping: Goal: Level of anxiety will decrease Outcome: Progressing   Problem: Elimination: Goal: Will not experience complications related to bowel motility Outcome: Progressing Goal: Will not experience complications related to urinary retention Outcome: Progressing   Problem: Pain Managment: Goal: General experience of comfort will improve Outcome: Progressing   Problem: Safety: Goal: Ability to remain free from injury will improve Outcome: Progressing   Problem: Skin Integrity: Goal: Risk for impaired skin integrity will decrease Outcome: Progressing   Problem: Education: Goal: Knowledge of risk factors and measures for prevention of condition will improve Outcome: Progressing   Problem: Coping: Goal: Psychosocial and spiritual needs will be supported Outcome: Progressing   Problem: Respiratory: Goal: Will maintain a patent airway Outcome: Progressing Goal: Complications related to the disease process, condition or treatment will be avoided or minimized Outcome: Progressing

## 2021-08-13 NOTE — Progress Notes (Signed)
PROGRESS NOTE    Sophia Hancock  ZOX:[id number] DOB: [birth year] DOA: 08/02/2021 PCP: Curly Rim, MD   Brief Narrative:  87 years old female with PMH significant for dementia, hypertension, hearing impairment, arthritis, history of SVT brought in by EMS from home with altered mental status.  Patient was found under the bed by her family not sure how long she was under the bed.  Assuming she might have fallen from bed and remained there throughout the night.  Patient has acquired COVID infection 2 days ago from [place],  her symptoms are very mild,  daughter reports having runny nose and slight congestion but denies any cough or shortness of breath. Patient admitted for acute confusion thought secondary to UTI and COVID infection.  She has completed antibiotics for UTI.  PT recommended  skilled nursing facility.  She is medically clear , since she is COVID+, She will need to stay in hospital 10 days before she can be discharged to SNF.  Last day of isolation 08/12/2021.  Can be accepted to SNF on 08/13/2021.  08/13/21: Seen at bedside.  Her son-in-law is present in the room.  She is quietly resting.  Assessment & Plan:   Principal Problem:   Altered mental status Active Problems:   Hypertension   SVT (supraventricular tachycardia) (HCC)   Hearing loss   Urinary incontinence   Osteoporosis   Chronic kidney disease (CKD), stage III (moderate) (HCC)   Acute metabolic encephalopathy/delirium, multifactorial: Suspect multifactorial, secondary to UTI, COVID+, Dehydration, decreased PO Patient was found on the floor, unsure how long she was on floor. On presentation CK 395.  UA consistent with UTI. Completed IV antibiotics and IV hydration CT head unremarkable, CT C-spine unremarkable. It appears she is now getting back to her baseline mental status. Continue Seroquel 25 mg twice daily, trazodone 50 mg nightly. Continue to regulate sleep and wake cycle Continue fall  precautions.   Treated UTI: UA consistent with UTI, Completed ceftriaxone, urine culture contaminated. Patient does not meet sepsis criteria. Lactic acid normal.  Discontinue ceftriaxone,  received 3 days of ceftriaxone.   COVID+: Oxygen saturation 96% on room air. Out of the isolation from 08/13/2021.   Fall, POA: Suspect she might have fallen from bed and remained on the floor throughout night. Skeletal work-up completely unremarkable. CK slightly elevated on presentation, received gentle IV hydration. PT and OT recommended skilled nursing facility.   Essential hypertension: BP is at goal Continue to hold hydrochlorothiazide to avoid hypotension, Maintain MAP greater than 65.  Situational insomnia Trazodone 50 mg nightly   DVT prophylaxis:Lovenox subcu daily. Code Status: DNR Family Communication: Updated her son-in-law who is also medical power of attorney at bedside on 08/13/2021.    Disposition Plan: Anticipate discharge to SNF on 08/14/2021.    Status is: Inpatient    Consultants:  None  Procedures: CT head and C-spine.  Antimicrobials:  Anti-infectives (From admission, onward)    Start     Dose/Rate Route Frequency Ordered Stop   08/03/21 1400  cefTRIAXone (ROCEPHIN) 1 g in sodium chloride 0.9 % 100 mL IVPB  Status:  Discontinued        1 g 200 mL/hr over 30 Minutes Intravenous Every 24 hours 08/02/21 1607 08/06/21 0801   08/02/21 1315  cefTRIAXone (ROCEPHIN) 1 g in sodium chloride 0.9 % 100 mL IVPB        1 g 200 mL/hr over 30 Minutes Intravenous  Once 08/02/21 1303 08/02/21 1348  Objective: Vitals:   08/12/21 1811 08/12/21 2114 08/13/21 0551 08/13/21 1357  BP: 129/65 (!) 144/118 92/81 104/68  Pulse: 100 99 81 79  Resp:  20 18 16   Temp: 98.4 F (36.9 C) 98.1 F (36.7 C) 98.6 F (37 C) 98.2 F (36.8 C)  TempSrc: Oral Axillary Oral Oral  SpO2: 98% 98% 95% 96%  Weight:      Height:        Intake/Output Summary (Last 24 hours) at  08/13/2021 1504 Last data filed at 08/12/2021 2200 Gross per 24 hour  Intake 600 ml  Output --  Net 600 ml   Filed Weights   08/02/21 1143 08/02/21 2118  Weight: 61.2 kg 57.4 kg    Examination:  General exam: Frail-appearing no acute distress.  She is quietly resting.   Respiratory system: Clear to auscultation with no wheezes or rales. cardiovascular system: Regular rate and rhythm no rubs or gallops.   Gastrointestinal system: Nondistended  Central nervous system: Sleepy  extremities: No lower extremity edema bilaterally.   Skin: No rashes or ulcerative lesions noted. Psychiatry: Mood is appropriate for condition and setting.   Data Reviewed: I have personally reviewed following labs and imaging studies  CBC: Recent Labs  Lab 08/08/21 0358  WBC 7.4  HGB 13.1  HCT 38.8  MCV 95.8  PLT 989   Basic Metabolic Panel: Recent Labs  Lab 08/08/21 0358 08/09/21 0346 08/10/21 0615  NA 137  --  135  K 3.0*  --  3.6  CL 102  --  104  CO2 25  --  23  GLUCOSE 97  --  92  BUN 23  --  24*  CREATININE 0.68 0.79 0.74  CALCIUM 8.4*  --  8.8*  MG 1.8  --   --   PHOS 3.2  --   --    GFR: Estimated Creatinine Clearance: 39.6 mL/min (by C-G formula based on SCr of 0.74 mg/dL). Liver Function Tests: No results for input(s): AST, ALT, ALKPHOS, BILITOT, PROT, ALBUMIN in the last 168 hours.  No results for input(s): LIPASE, AMYLASE in the last 168 hours. No results for input(s): AMMONIA in the last 168 hours. Coagulation Profile: No results for input(s): INR, PROTIME in the last 168 hours. Cardiac Enzymes: No results for input(s): CKTOTAL, CKMB, CKMBINDEX, TROPONINI in the last 168 hours.  BNP (last 3 results) No results for input(s): PROBNP in the last 8760 hours. HbA1C: No results for input(s): HGBA1C in the last 72 hours. CBG: No results for input(s): GLUCAP in the last 168 hours. Lipid Profile: No results for input(s): CHOL, HDL, LDLCALC, TRIG, CHOLHDL, LDLDIRECT in  the last 72 hours. Thyroid Function Tests: No results for input(s): TSH, T4TOTAL, FREET4, T3FREE, THYROIDAB in the last 72 hours. Anemia Panel: No results for input(s): VITAMINB12, FOLATE, FERRITIN, TIBC, IRON, RETICCTPCT in the last 72 hours. Sepsis Labs: No results for input(s): PROCALCITON, LATICACIDVEN in the last 168 hours.   No results found for this or any previous visit (from the past 240 hour(s)).   Radiology Studies: No results found.  Scheduled Meds:  docusate sodium  100 mg Oral BID   enoxaparin (LOVENOX) injection  40 mg Subcutaneous Q24H   QUEtiapine  12.5 mg Oral BID   traZODone  50 mg Oral QHS   Continuous Infusions:     LOS: 10 days    Time spent: 25 mins    Kayleen Memos, MD [facility]   If 7PM-7AM, please contact night-coverage

## 2021-08-13 NOTE — TOC Progression Note (Addendum)
Transition of Care [facility]) - Progression Note    Patient Details  Name: Sophia Hancock MRN: [id number] Date of Birth: 1928-07-01  [facility] CM/SW Contact  Ross Ludwig, [place] Phone Number: 08/13/2021, 11:04 AM  Clinical Narrative:     CSW sent updated clinicals to SNFs awaiting for updated bed offers.  CSW to continue to follow patient's progress throughout discharge planning.  Patient's family is requesting [facility], [place] left a message with admissions worker.  5:05pm  Provided choices of SNF to patient's daughter.  She will review and talk to [facility] case manager tomorrow.  Patient's daughter still would prefer [facility] or [facility], both are still pending review.   Expected Discharge Plan: [place] Barriers to Discharge: Continued Medical Work up  Expected Discharge Plan and Services Expected Discharge Plan: [place] Choice: [place] arrangements for the past 2 months: Single Family Home                                       Social Determinants of Health (SDOH) Interventions    Readmission Risk Interventions No flowsheet data found.

## 2021-08-13 NOTE — Progress Notes (Incomplete)
Pt has three bumps on her top lip. They are pink and clustered. It looks like it could be the start of possibly a rash. MD notified.

## 2021-08-14 ENCOUNTER — Inpatient Hospital Stay (HOSPITAL_COMMUNITY): Payer: Medicare Other

## 2021-08-14 MED ORDER — QUETIAPINE FUMARATE 25 MG PO TABS: 12.5000 mg | ORAL_TABLET | Freq: Two times a day (BID) | ORAL | 0 refills | Status: DC

## 2021-08-14 MED ORDER — TRAZODONE HCL 50 MG PO TABS: 50.0000 mg | ORAL_TABLET | Freq: Every evening | ORAL | 0 refills | Status: AC | PRN

## 2021-08-14 NOTE — Plan of Care (Signed)
°  Problem: Clinical Measurements: Goal: Ability to maintain clinical measurements within normal limits will improve 08/14/2021 2239 by Terrence Dupont, RN Outcome: Adequate for Discharge 08/14/2021 2229 by Terrence Dupont, RN Outcome: Adequate for Discharge Goal: Will remain free from infection 08/14/2021 2239 by Terrence Dupont, RN Outcome: Adequate for Discharge 08/14/2021 2229 by Terrence Dupont, RN Outcome: Adequate for Discharge Goal: Diagnostic test results will improve 08/14/2021 2239 by Terrence Dupont, RN Outcome: Adequate for Discharge 08/14/2021 2229 by Terrence Dupont, RN Outcome: Adequate for Discharge Goal: Respiratory complications will improve 08/14/2021 2239 by Terrence Dupont, RN Outcome: Adequate for Discharge 08/14/2021 2229 by Terrence Dupont, RN Outcome: Adequate for Discharge Goal: Cardiovascular complication will be avoided 08/14/2021 2239 by Terrence Dupont, RN Outcome: Adequate for Discharge 08/14/2021 2229 by Terrence Dupont, RN Outcome: Adequate for Discharge   Problem: Activity: Goal: Risk for activity intolerance will decrease 08/14/2021 2239 by Terrence Dupont, RN Outcome: Adequate for Discharge 08/14/2021 2229 by Terrence Dupont, RN Outcome: Adequate for Discharge   Problem: Nutrition: Goal: Adequate nutrition will be maintained 08/14/2021 2239 by Terrence Dupont, RN Outcome: Adequate for Discharge 08/14/2021 2229 by Terrence Dupont, RN Outcome: Adequate for Discharge   Problem: Coping: Goal: Level of anxiety will decrease 08/14/2021 2239 by Terrence Dupont, RN Outcome: Adequate for Discharge 08/14/2021 2229 by Terrence Dupont, RN Outcome: Adequate for Discharge   Problem: Elimination: Goal: Will not experience complications related to bowel motility 08/14/2021 2239 by Terrence Dupont, RN Outcome: Adequate for Discharge 08/14/2021 2229 by Terrence Dupont, RN Outcome: Adequate for Discharge Goal: Will not experience complications related to urinary  retention 08/14/2021 2239 by Terrence Dupont, RN Outcome: Adequate for Discharge 08/14/2021 2229 by Terrence Dupont, RN Outcome: Adequate for Discharge   Problem: Pain Managment: Goal: General experience of comfort will improve 08/14/2021 2239 by Terrence Dupont, RN Outcome: Adequate for Discharge 08/14/2021 2229 by Terrence Dupont, RN Outcome: Adequate for Discharge   Problem: Safety: Goal: Ability to remain free from injury will improve 08/14/2021 2239 by Terrence Dupont, RN Outcome: Adequate for Discharge 08/14/2021 2229 by Terrence Dupont, RN Outcome: Adequate for Discharge   Problem: Skin Integrity: Goal: Risk for impaired skin integrity will decrease 08/14/2021 2239 by Terrence Dupont, RN Outcome: Adequate for Discharge 08/14/2021 2229 by Terrence Dupont, RN Outcome: Adequate for Discharge   Problem: Education: Goal: Knowledge of risk factors and measures for prevention of condition will improve 08/14/2021 2239 by Terrence Dupont, RN Outcome: Adequate for Discharge 08/14/2021 2229 by Terrence Dupont, RN Outcome: Adequate for Discharge   Problem: Coping: Goal: Psychosocial and spiritual needs will be supported 08/14/2021 2239 by Terrence Dupont, RN Outcome: Adequate for Discharge 08/14/2021 2229 by Terrence Dupont, RN Outcome: Adequate for Discharge   Problem: Respiratory: Goal: Will maintain a patent airway 08/14/2021 2239 by Terrence Dupont, RN Outcome: Adequate for Discharge 08/14/2021 2229 by Terrence Dupont, RN Outcome: Adequate for Discharge Goal: Complications related to the disease process, condition or treatment will be avoided or minimized 08/14/2021 2239 by Terrence Dupont, RN Outcome: Adequate for Discharge 08/14/2021 2229 by Terrence Dupont, RN Outcome: Adequate for Discharge   Problem: Safety: Goal: Ability to remain free from injury will improve Outcome: Adequate for Discharge

## 2021-08-14 NOTE — Plan of Care (Signed)
°  Problem: Clinical Measurements: Goal: Ability to maintain clinical measurements within normal limits will improve Outcome: Adequate for Discharge Goal: Will remain free from infection Outcome: Adequate for Discharge Goal: Diagnostic test results will improve Outcome: Adequate for Discharge Goal: Respiratory complications will improve Outcome: Adequate for Discharge Goal: Cardiovascular complication will be avoided Outcome: Adequate for Discharge   Problem: Activity: Goal: Risk for activity intolerance will decrease Outcome: Adequate for Discharge   Problem: Nutrition: Goal: Adequate nutrition will be maintained Outcome: Adequate for Discharge   Problem: Coping: Goal: Level of anxiety will decrease Outcome: Adequate for Discharge   Problem: Elimination: Goal: Will not experience complications related to bowel motility Outcome: Adequate for Discharge Goal: Will not experience complications related to urinary retention Outcome: Adequate for Discharge   Problem: Pain Managment: Goal: General experience of comfort will improve Outcome: Adequate for Discharge   Problem: Safety: Goal: Ability to remain free from injury will improve Outcome: Adequate for Discharge   Problem: Skin Integrity: Goal: Risk for impaired skin integrity will decrease Outcome: Adequate for Discharge   Problem: Education: Goal: Knowledge of risk factors and measures for prevention of condition will improve Outcome: Adequate for Discharge   Problem: Coping: Goal: Psychosocial and spiritual needs will be supported Outcome: Adequate for Discharge   Problem: Respiratory: Goal: Will maintain a patent airway Outcome: Adequate for Discharge Goal: Complications related to the disease process, condition or treatment will be avoided or minimized Outcome: Adequate for Discharge    2222 Pt left via stretcher with PTAR for SNF [employer].  VSS, Orientation at baseline, pt belongings taken by family.

## 2021-08-14 NOTE — Care Management Important Message (Signed)
Important Message  Patient Details IM Letter placed in the Patients room. Name: Sophia Hancock MRN: [id number] Date of Birth: 1927/09/16   Medicare Important Message Given:  Yes     Kerin Salen 08/14/2021, 11:47 AM

## 2021-08-14 NOTE — Discharge Summary (Signed)
Discharge Summary  Sophia Hancock:[id number] DOB: [birth year]  PCP: Curly Rim, MD  Admit date: 08/02/2021 Discharge date: 08/14/2021  Time spent:35 minutes   Recommendations for Outpatient Follow-up:  Follow up with palliative care    Discharge Diagnoses:  Active Hospital Problems   Diagnosis Date Noted   Altered mental status 08/02/2021   Chronic kidney disease (CKD), stage III (moderate) (HCC) 04/13/2019   Urinary incontinence 09/26/2017   Osteoporosis 12/15/2013   Hearing loss 12/03/2011   Hypertension    SVT (supraventricular tachycardia) [facility])     Resolved Hospital Problems  No resolved problems to display.    Discharge Condition: Stable.   Diet recommendation: Resume diet as tolerated.   Vitals:   08/13/21 1357 08/14/21 0505  BP: 104/68 113/86  Pulse: 79 92  Resp: 16 18  Temp: 98.2 F (36.8 C) 97.9 F (36.6 C)  SpO2: 96% 97%    History of present illness:  87 years old female with PMH significant for dementia, hypertension, hearing impairment, arthritis, history of SVT brought in by EMS from home with altered mental status.  Patient was found under the bed by her family not sure how long she was under the bed.  Assuming she might have fallen from bed and remained there throughout the night.  Patient tested positive for COVID-19 viral infection. Patient admitted for acute confusion thought secondary to presumptive UTI and COVID-19 viral infection.  She has completed antibiotics for UTI.  PT recommended  skilled nursing facility.  She is medically clear.  Last day of isolation 08/12/2021.  Had repeat CT head on 08/14/21, it was non acute.   08/14/21:  Seen at bedside.  Her son-in-law in the room.  O2 saturation 97% on room air.  BP 113/86, pulse 92, respiration rate 18.    Hospital Course:  Principal Problem:   Altered mental status Active Problems:   Hypertension   SVT (supraventricular tachycardia) (HCC)   Hearing loss   Urinary incontinence    Osteoporosis   Chronic kidney disease (CKD), stage III (moderate) (HCC)  Acute metabolic encephalopathy, multifactorial: Suspect multifactorial, secondary to presumptive UTI, COVID+, Dehydration, decreased PO Patient was found on the floor, unsure how long she was on floor. On presentation CK 395.  UA consistent with UTI. Completed IV antibiotics and IV hydration CT head unremarkable, CT C-spine unremarkable on admission. Continue Seroquel 25 mg twice daily, trazodone 50 mg nightly as needed. Continue to regulate sleep and wake cycle Continue fall precautions. Repeated CT head on 08/14/2021, nonacute.   Treated UTI: UA consistent with UTI, Completed ceftriaxone, urine culture contaminated. Patient does not meet sepsis criteria. Lactic acid normal.  Discontinue ceftriaxone,  received 3 days of ceftriaxone.   COVID+: Oxygen saturation 96% on room air. Out of the isolation from 08/13/2021.   Fall, POA: Suspect she might have fallen from bed and remained on the floor throughout night. Skeletal work-up completely unremarkable. CK slightly elevated on presentation, received gentle IV hydration. PT and OT recommended skilled nursing facility. Continue fall precautions.   Essential hypertension: BP is at goal Continue to hold hydrochlorothiazide to avoid hypotension, Maintain MAP greater than 65.   Situational insomnia Trazodone 50 mg nightly as needed.      Code Status: DNR    Consultants:  None   Procedures: CT head and C-spine on 08/02/2021.  Repeated CT head on 08/14/2021.   Antimicrobials:   Anti-infectives (From admission, onward)        Start  Dose/Rate Route Frequency Ordered Stop    08/03/21 1400   cefTRIAXone (ROCEPHIN) 1 g in sodium chloride 0.9 % 100 mL IVPB  Status:  Discontinued        1 g 200 mL/hr over 30 Minutes Intravenous Every 24 hours 08/02/21 1607 08/06/21 0801    08/02/21 1315   cefTRIAXone (ROCEPHIN) 1 g in sodium chloride 0.9 % 100 mL IVPB         1 g 200 mL/hr over 30 Minutes Intravenous  Once 08/02/21 1303 08/02/21 1348             Discharge Exam: BP 113/86 (BP Location: Right Arm)    Pulse 92    Temp 97.9 F (36.6 C) (Oral)    Resp 18    Ht 5\' 4"  (1.626 m)    Wt 57.4 kg    SpO2 97%    BMI 21.72 kg/m  General: 87 y.o. year-old female well developed well nourished in no acute distress.  Alert and pleasantly demented Cardiovascular: Regular rate and rhythm with no rubs or gallops.  No thyromegaly or JVD noted.   Respiratory: Clear to auscultation with no wheezes or rales. Good inspiratory effort. Abdomen: Soft nontender nondistended with normal bowel sounds x4 quadrants. Musculoskeletal: No lower extremity edema bilaterally.   Psychiatry: Mood is appropriate for condition and setting  Discharge Instructions You were cared for by a hospitalist during your hospital stay. If you have any questions about your discharge medications or the care you received while you were in the hospital after you are discharged, you can call the unit and asked to speak with the hospitalist on call if the hospitalist that took care of you is not available. Once you are discharged, your primary care physician will handle any further medical issues. Please note that NO REFILLS for any discharge medications will be authorized once you are discharged, as it is imperative that you return to your primary care physician (or establish a relationship with a primary care physician if you do not have one) for your aftercare needs so that they can reassess your need for medications and monitor your lab values.   Allergies as of 08/14/2021       Reactions   Tylenol [acetaminophen]    Pt reports being very sensitive to all pain medications, including OTC pain medications. Pt reports feeling swimmy headed, knocked out, or extremely nervous when she has taken pain medications.         Medication List     STOP taking these medications    azithromycin 250  MG tablet Commonly known as: ZITHROMAX   hydrochlorothiazide 12.5 MG capsule Commonly known as: MICROZIDE       TAKE these medications    Fluad Quadrivalent 0.5 ML injection Generic drug: influenza vaccine adjuvanted Inject into the muscle.   ibuprofen 200 MG tablet Commonly known as: ADVIL Take 200 mg by mouth every 6 (six) hours as needed for headache or mild pain.   QUEtiapine 25 MG tablet Commonly known as: SEROQUEL Take 0.5 tablets (12.5 mg total) by mouth 2 (two) times daily for 10 days.   Refresh Optive Advanced 0.5-1-0.5 % Soln Generic drug: Carboxymeth-Glycerin-Polysorb Apply 1 drop to eye daily as needed (dry eyes).   traZODone 50 MG tablet Commonly known as: DESYREL Take 1 tablet (50 mg total) by mouth at bedtime as needed for up to 10 days for sleep.       Allergies  Allergen Reactions   Tylenol [Acetaminophen]  Pt reports being very sensitive to all pain medications, including OTC pain medications. Pt reports feeling swimmy headed, knocked out, or extremely nervous when she has taken pain medications.     Follow-up Information     Corrington, Kip A, MD. Call today.   Specialty: Family Medicine Why: Please call for a post hospital follow-up appointment. Contact information: [place] [address] [phone]                  The results of significant diagnostics from this hospitalization (including imaging, microbiology, ancillary and laboratory) are listed below for reference.    Significant Diagnostic Studies: DG Chest 1 View  Result Date: 08/02/2021 CLINICAL DATA:  Fall. EXAM: CHEST  1 VIEW COMPARISON:  August 04, 2009. FINDINGS: Stable cardiomediastinal silhouette. Both lungs are clear. The visualized skeletal structures are unremarkable. IMPRESSION: No active disease. Electronically Signed   By: Marijo Conception M.D.   On: 08/02/2021 12:35   DG Elbow Complete Right  Result Date: 08/02/2021 CLINICAL DATA:   Fall EXAM: RIGHT ELBOW - COMPLETE 3+ VIEW COMPARISON:  None. FINDINGS: There is no evidence of fracture, dislocation, or joint effusion. There is no evidence of arthropathy or other focal bone abnormality. Soft tissues are unremarkable. IMPRESSION: Negative. Electronically Signed   By: Franchot Gallo M.D.   On: 08/02/2021 11:56   CT HEAD WO CONTRAST (5MM)  Result Date: 08/14/2021 CLINICAL DATA:  Confusion and dementia in a female at age 76. EXAM: CT HEAD WITHOUT CONTRAST TECHNIQUE: Contiguous axial images were obtained from the base of the skull through the vertex without intravenous contrast. COMPARISON:  August 02, 2021. FINDINGS: Brain: No evidence of acute infarction, hemorrhage, hydrocephalus, extra-axial collection or mass lesion/mass effect. Signs of atrophy and extensive chronic microvascular ischemic change as before. Vascular: No hyperdense vessel or unexpected calcification. Skull: Normal. Negative for fracture or focal lesion. Sinuses/Orbits: Frothy secretions in bilateral sphenoid sinuses. Maxillary sinuses are incompletely imaged as are the orbits. No acute process elsewhere in the sinuses or orbits. Other: None IMPRESSION: No acute intracranial abnormality. Signs of atrophy and extensive chronic microvascular ischemic change as before. Findings of suspected sinus disease. Electronically Signed   By: Zetta Bills M.D.   On: 08/14/2021 12:46   CT Head Wo Contrast  Result Date: 08/02/2021 CLINICAL DATA:  Unwitnessed fall, injury EXAM: CT HEAD WITHOUT CONTRAST TECHNIQUE: Contiguous axial images were obtained from the base of the skull through the vertex without intravenous contrast. COMPARISON:  01/23/2019 FINDINGS: Brain: Limited with motion artifact. Similar age-related atrophy and chronic white matter microvascular ischemic changes throughout both cerebral hemispheres. No acute intracranial hemorrhage, definite new mass lesion, new acute infarction, midline shift, herniation, hydrocephalus,  or extra-axial fluid collection. No focal mass effect or edema. Cisterns are patent. Cerebellar atrophy as well. Vascular: Intracranial atherosclerosis.  No hyperdense vessel Skull: Normal. Negative for fracture or focal lesion. Sinuses/Orbits: No acute finding. Other: None. IMPRESSION: Limited with motion artifact. Stable atrophy and white matter microvascular changes. No significant interval change or acute process by noncontrast CT. Electronically Signed   By: Jerilynn Mages.  Shick M.D.   On: 08/02/2021 13:21   CT Cervical Spine Wo Contrast  Result Date: 08/02/2021 CLINICAL DATA:  Fall from bed EXAM: CT CERVICAL SPINE WITHOUT CONTRAST TECHNIQUE: Multidetector CT imaging of the cervical spine was performed without intravenous contrast. Multiplanar CT image reconstructions were also generated. COMPARISON:  Thyroid ultrasound, 04/05/2021 FINDINGS: Examination is very limited by pervasive motion artifact throughout, including  on technical repeat acquisition. Alignment: Normal. Skull base and vertebrae: No acute fracture. No primary bone lesion or focal pathologic process. Soft tissues and spinal canal: No prevertebral fluid or swelling. No visible canal hematoma. Disc levels: Mild multilevel disc space height loss and osteophytosis Upper chest: Negative. Other: Enlarged left lobe of the thyroid, which deflects the trachea rightward, previously imaged by dedicated thyroid ultrasound. This has been evaluated on previous imaging. (ref: J Am Coll Radiol. 2015 Feb;12(2): 143-50). IMPRESSION: 1. Examination is very limited by pervasive motion artifact throughout, including on technical repeat acquisition. Within this limitation, no obvious fracture or static subluxation of the cervical spine. 2. Mild multilevel cervical disc degenerative disease. Electronically Signed   By: Delanna Ahmadi M.D.   On: 08/02/2021 13:25   DG Hips Bilat W or Wo Pelvis 3-4 Views  Result Date: 08/02/2021 CLINICAL DATA:  Fall EXAM: DG HIP (WITH OR  WITHOUT PELVIS) 3-4V BILAT COMPARISON:  04/22/2013 FINDINGS: Negative for hip fracture.  Advanced osteoarthritis right hip. Negative for pelvic fracture. IMPRESSION: Negative for fracture.  Advanced degenerative change right hip Electronically Signed   By: Franchot Gallo M.D.   On: 08/02/2021 11:56    Microbiology: No results found for this or any previous visit (from the past 240 hour(s)).   Labs: Basic Metabolic Panel: Recent Labs  Lab 08/08/21 0358 08/09/21 0346 08/10/21 0615  NA 137  --  135  K 3.0*  --  3.6  CL 102  --  104  CO2 25  --  23  GLUCOSE 97  --  92  BUN 23  --  24*  CREATININE 0.68 0.79 0.74  CALCIUM 8.4*  --  8.8*  MG 1.8  --   --   PHOS 3.2  --   --    Liver Function Tests: No results for input(s): AST, ALT, ALKPHOS, BILITOT, PROT, ALBUMIN in the last 168 hours. No results for input(s): LIPASE, AMYLASE in the last 168 hours. No results for input(s): AMMONIA in the last 168 hours. CBC: Recent Labs  Lab 08/08/21 0358  WBC 7.4  HGB 13.1  HCT 38.8  MCV 95.8  PLT 210   Cardiac Enzymes: No results for input(s): CKTOTAL, CKMB, CKMBINDEX, TROPONINI in the last 168 hours. BNP: BNP (last 3 results) No results for input(s): BNP in the last 8760 hours.  ProBNP (last 3 results) No results for input(s): PROBNP in the last 8760 hours.  CBG: No results for input(s): GLUCAP in the last 168 hours.     Signed:  Kayleen Memos, MD [facility] 08/14/2021, 1:02 PM

## 2021-08-14 NOTE — Progress Notes (Signed)
Report called @ 19:42 to [employer] spoke to [place] all questions & concerns addressed. Pt still here at [facility] long-family in room.

## 2021-08-14 NOTE — TOC Progression Note (Signed)
Transition of Care [facility]) - Progression Note    Patient Details  Name: Sophia Hancock MRN: [id number] Date of Birth: [birth year]  [facility] CM/SW Contact  Purcell Mouton, RN Phone Number: 08/14/2021, 2:46 PM  Clinical Narrative:    Pt's daughter accepted [employer]. Waiting for paperwork to be signed by family.    Expected Discharge Plan: [place] Barriers to Discharge: Continued Medical Work up  Expected Discharge Plan and Services Expected Discharge Plan: [place] Choice: [place] arrangements for the past 2 months: Single Family Home Expected Discharge Date: 08/14/21                                     Social Determinants of Health (SDOH) Interventions    Readmission Risk Interventions No flowsheet data found.

## 2021-08-24 ENCOUNTER — Other Ambulatory Visit (HOSPITAL_BASED_OUTPATIENT_CLINIC_OR_DEPARTMENT_OTHER): Payer: Self-pay

## 2021-09-17 ENCOUNTER — Other Ambulatory Visit: Payer: Self-pay

## 2021-09-17 ENCOUNTER — Emergency Department (HOSPITAL_COMMUNITY)
Admission: EM | Admit: 2021-09-17 | Discharge: 2021-09-18 | Disposition: A | Discharge status: Home / Self Care | Payer: Medicare Other | Attending: Emergency Medicine | Admitting: Emergency Medicine

## 2021-09-17 ENCOUNTER — Encounter (HOSPITAL_COMMUNITY): Payer: Self-pay

## 2021-09-17 DIAGNOSIS — S0012XA Contusion of left eyelid and periocular area, initial encounter: Secondary | ICD-10-CM | POA: Insufficient documentation

## 2021-09-17 DIAGNOSIS — S61412A Laceration without foreign body of left hand, initial encounter: Secondary | ICD-10-CM | POA: Insufficient documentation

## 2021-09-17 DIAGNOSIS — N83201 Unspecified ovarian cyst, right side: Secondary | ICD-10-CM | POA: Diagnosis not present

## 2021-09-17 DIAGNOSIS — S42012A Anterior displaced fracture of sternal end of left clavicle, initial encounter for closed fracture: Secondary | ICD-10-CM | POA: Diagnosis not present

## 2021-09-17 DIAGNOSIS — S0240EA Zygomatic fracture, right side, initial encounter for closed fracture: Secondary | ICD-10-CM | POA: Diagnosis not present

## 2021-09-17 DIAGNOSIS — N83209 Unspecified ovarian cyst, unspecified side: Secondary | ICD-10-CM | POA: Diagnosis not present

## 2021-09-17 DIAGNOSIS — I10 Essential (primary) hypertension: Secondary | ICD-10-CM | POA: Diagnosis not present

## 2021-09-17 DIAGNOSIS — N83299 Other ovarian cyst, unspecified side: Secondary | ICD-10-CM

## 2021-09-17 DIAGNOSIS — S40012A Contusion of left shoulder, initial encounter: Secondary | ICD-10-CM | POA: Insufficient documentation

## 2021-09-17 DIAGNOSIS — W19XXXA Unspecified fall, initial encounter: Secondary | ICD-10-CM | POA: Diagnosis not present

## 2021-09-17 DIAGNOSIS — S42015A Posterior displaced fracture of sternal end of left clavicle, initial encounter for closed fracture: Secondary | ICD-10-CM

## 2021-09-17 DIAGNOSIS — S20212A Contusion of left front wall of thorax, initial encounter: Secondary | ICD-10-CM | POA: Diagnosis not present

## 2021-09-17 DIAGNOSIS — R296 Repeated falls: Secondary | ICD-10-CM | POA: Insufficient documentation

## 2021-09-17 DIAGNOSIS — F0390 Unspecified dementia without behavioral disturbance: Secondary | ICD-10-CM | POA: Diagnosis not present

## 2021-09-17 DIAGNOSIS — S0993XA Unspecified injury of face, initial encounter: Secondary | ICD-10-CM | POA: Diagnosis present

## 2021-09-17 DIAGNOSIS — S0093XA Contusion of unspecified part of head, initial encounter: Secondary | ICD-10-CM | POA: Insufficient documentation

## 2021-09-17 NOTE — ED Triage Notes (Signed)
BIBA from [facility] after a fall, unknown LOC, hematoma on head, no blood thinners.

## 2021-09-18 ENCOUNTER — Emergency Department (HOSPITAL_COMMUNITY): Payer: Medicare Other

## 2021-09-18 DIAGNOSIS — S0240EA Zygomatic fracture, right side, initial encounter for closed fracture: Secondary | ICD-10-CM | POA: Diagnosis not present

## 2021-09-18 LAB — CBC WITH DIFFERENTIAL/PLATELET
Abs Immature Granulocytes: 0.06 10*3/uL (ref 0.00–0.07)
Basophils Absolute: 0.1 10*3/uL (ref 0.0–0.1)
Basophils Relative: 0 %
Eosinophils Absolute: 0.1 10*3/uL (ref 0.0–0.5)
Eosinophils Relative: 1 %
HCT: 39.4 % (ref 36.0–46.0)
Hemoglobin: 13 g/dL (ref 12.0–15.0)
Immature Granulocytes: 1 %
Lymphocytes Relative: 14 %
Lymphs Abs: 1.7 10*3/uL (ref 0.7–4.0)
MCH: 32.4 pg (ref 26.0–34.0)
MCHC: 33 g/dL (ref 30.0–36.0)
MCV: 98.3 fL (ref 80.0–100.0)
Monocytes Absolute: 1 10*3/uL (ref 0.1–1.0)
Monocytes Relative: 8 %
Neutro Abs: 9.1 10*3/uL — ABNORMAL HIGH (ref 1.7–7.7)
Neutrophils Relative %: 76 %
Platelets: 265 10*3/uL (ref 150–400)
RBC: 4.01 MIL/uL (ref 3.87–5.11)
RDW: 13.2 % (ref 11.5–15.5)
WBC: 12 10*3/uL — ABNORMAL HIGH (ref 4.0–10.5)
nRBC: 0 % (ref 0.0–0.2)

## 2021-09-18 LAB — URINALYSIS, ROUTINE W REFLEX MICROSCOPIC
Bilirubin Urine: NEGATIVE
Glucose, UA: NEGATIVE mg/dL
Ketones, ur: 20 mg/dL — AB
Leukocytes,Ua: NEGATIVE
Nitrite: NEGATIVE
Protein, ur: NEGATIVE mg/dL
Specific Gravity, Urine: 1.021 (ref 1.005–1.030)
pH: 6 (ref 5.0–8.0)

## 2021-09-18 LAB — BASIC METABOLIC PANEL
Anion gap: 8 (ref 5–15)
BUN: 33 mg/dL — ABNORMAL HIGH (ref 8–23)
CO2: 25 mmol/L (ref 22–32)
Calcium: 8.7 mg/dL — ABNORMAL LOW (ref 8.9–10.3)
Chloride: 102 mmol/L (ref 98–111)
Creatinine, Ser: 0.66 mg/dL (ref 0.44–1.00)
GFR, Estimated: >60 mL/min (ref >60)
Glucose, Bld: 105 mg/dL — ABNORMAL HIGH (ref 70–99)
Potassium: 3.9 mmol/L (ref 3.5–5.1)
Sodium: 135 mmol/L (ref 135–145)

## 2021-09-18 MED ORDER — SODIUM CHLORIDE (PF) 0.9 % IJ SOLN
INTRAMUSCULAR | Status: AC
  Filled 2021-09-18: qty 50

## 2021-09-18 MED ORDER — IOHEXOL 300 MG/ML  SOLN
80.0000 mL | Freq: Once | INTRAMUSCULAR | Status: AC | PRN
  Administered 2021-09-18: 04:00:00 80 mL via INTRAVENOUS

## 2021-09-18 MED ORDER — LORAZEPAM 2 MG/ML IJ SOLN
0.5000 mg | Freq: Once | INTRAMUSCULAR | Status: AC
Start: 2021-09-18 — End: 2021-09-18
  Administered 2021-09-18: 04:00:00 0.5 mg via INTRAVENOUS
  Filled 2021-09-18: qty 1

## 2021-09-18 NOTE — Discharge Instructions (Addendum)
Clavicle fracture-placed in a sling please wear at all times over-the-counter pain medication as needed follow-up with orthopedics. zygomatic arch fracture-May follow-up with your PCP and/or ENT for further evaluation. Right ovarian cysts-follow-up with your PCP for further evaluation.  Come back to the emergency department if you develop chest pain, shortness of breath, severe abdominal pain, uncontrolled nausea, vomiting, diarrhea.

## 2021-09-18 NOTE — ED Notes (Signed)
Call to [facility] for transport back to SNF

## 2021-09-18 NOTE — ED Notes (Signed)
Pt is sleeping, daughter is at the bedside, notified her that [facility] has been contacted for pick up.  ETA not available

## 2021-09-18 NOTE — ED Notes (Signed)
Call to [facility] to give report, unable to reach nurse.  Daughter is at the bedside and she was updated

## 2021-09-18 NOTE — ED Provider Notes (Addendum)
[facility] DEPT Provider Note   CSN: [id number] Arrival date & time: 09/17/21  2243     History  Chief Complaint  Patient presents with   Sophia Hancock is a 88 y.o. female.  HPI  HPI will be deferred due to level 5 caveat dementia  Patient with medical history including hypertension, dementia, SVT presents to the emergency department due to a fall.  Patient is coming from [facility] facility due to a fall, apparently this was a unwitnessed fall, patient was in bed fell over onto her face, there is no loss of conscious was found immediately, not on anticoagulants.  Patient was endorsing back pain which she cannot articulate where it was and has a noted bruise on the front of her head and a laceration on her left hand.    Patient's daughter was at bedside was able to provide HPI, she states that she was in was at a rehab facility where she suffered another fall last Friday and she is not evaluated for this, states that she fell onto her left side and on Monday the daughter noted that the patient has ecchymosis on her chest as well as her left shoulder.  Daughter states that since then patient been l more sleepy, and has not been eating or drinking very much, she denies any fevers, chills, congestion sore throat, cough, nausea, vomiting, or any urinary symptoms.  Daughter states that patient is generally fidgety and will have frequent falls as the patient will tries to get out of bed or the wheelchair,  she states that other than the patient being more sleepy and having lack of appetite she is at her baseline.    Home Medications Prior to Admission medications   Medication Sig Start Date End Date Taking? Authorizing Provider  Carboxymeth-Glycerin-Polysorb (REFRESH OPTIVE ADVANCED) 0.5-1-0.5 % SOLN Apply 1 drop to eye daily as needed (dry eyes).   Yes [provider]  divalproex (DEPAKOTE SPRINKLE) 125 MG capsule Take 125 mg by mouth 2  (two) times daily. 09/14/21  Yes [provider]  docusate sodium (COLACE) 100 MG capsule Take 100 mg by mouth 2 (two) times daily.   Yes [provider]  ibuprofen (ADVIL,MOTRIN) 200 MG tablet Take 200 mg by mouth every 6 (six) hours as needed for headache or mild pain.   Yes [provider]  LORazepam (ATIVAN) 0.5 MG tablet Take 0.5 mg by mouth every 8 (eight) hours as needed for anxiety.   Yes [provider]  melatonin 5 MG TABS Take 5 mg by mouth at bedtime.   Yes [provider]  pramoxine-hydrocortisone (PROCTOCREAM-HC) 1-1 % rectal cream Place 1 application rectally as needed for hemorrhoids or anal itching.   Yes [provider]  traZODone (DESYREL) 50 MG tablet Take 1 tablet (50 mg total) by mouth at bedtime as needed for up to 10 days for sleep. Patient taking differently: Take 50 mg by mouth at bedtime. 08/14/21 09/18/22 Yes Kayleen Memos, DO  influenza vaccine adjuvanted (FLUAD QUADRIVALENT) 0.5 ML injection Inject into the muscle. 06/06/21         Allergies    Tylenol [acetaminophen]    Review of Systems   Review of Systems  Unable to perform ROS: Dementia   Physical Exam Updated Vital Signs BP 94/66    Pulse 79    Temp 97.6 F (36.4 C) (Axillary)    Resp 18    SpO2 96%  Physical Exam  Vitals and nursing note reviewed.  Constitutional:      General: She is not in acute distress.    Appearance: She is ill-appearing. She is not toxic-appearing.  HENT:     Head: Normocephalic and atraumatic.     Comments: Noted hematoma on the above the left eyebrow, hemodynamically stable, no other gross abnormalities of the head present, no raccoon eyes or battle sign present,    Nose: No congestion or rhinorrhea.     Mouth/Throat:     Mouth: Mucous membranes are dry.     Pharynx: Oropharynx is clear. No oropharyngeal exudate or posterior oropharyngeal erythema.     Comments: No trismus, oropharynx visualized no oral trauma  present. Eyes:     Extraocular Movements: Extraocular movements intact.     Conjunctiva/sclera: Conjunctivae normal.     Pupils: Pupils are equal, round, and reactive to light.  Neck:     Comments: In a c-collar difficult to assess for C-spine. Cardiovascular:     Rate and Rhythm: Normal rate and regular rhythm.     Pulses: Normal pulses.     Heart sounds: No murmur heard.   No friction rub. No gallop.  Pulmonary:     Effort: No respiratory distress.     Breath sounds: No wheezing, rhonchi or rales.  Chest:     Chest wall: No tenderness.  Abdominal:     Palpations: Abdomen is soft.     Tenderness: There is no abdominal tenderness. There is no right CVA tenderness or left CVA tenderness.  Musculoskeletal:     Comments: Moving all 4 extremities, neurovascular fully intact in upper lower extremities, spine was palpated no step-off deformities present, generalized tenderness along her spine does not focalized.  No pelvis instability, no internal or external rotation present, history nontender to palpation.  Patient has noted ecchymosis on the left upper anterior part of her chest ribs 1 through 3, as well as ecchymosis on her left shoulder, there is no crepitus or deformities present, no obvious dislocation of the humeral head.  Full range of motion in that left shoulder.  Skin:    General: Skin is warm and dry.     Comments: Patient has noted skin tears in various stages of healing on her upper arms, no signs of infection present.  Also has ecchymosis on her right upper thigh, she has a small skin tear noted on her left pinky finger hemodynamically stable.  Neurological:     Mental Status: She is alert.     Comments: Patient is at her baseline per family member, no facial asymmetry no unilateral weakness able to follow two-step commands.  Psychiatric:        Mood and Affect: Mood normal.    ED Results / Procedures / Treatments   Labs (all labs ordered are listed, but only abnormal  results are displayed) Labs Reviewed  BASIC METABOLIC PANEL - Abnormal; Notable for the following components:      Result Value   Glucose, Bld 105 (*)    BUN 33 (*)    Calcium 8.7 (*)    All other components within normal limits  CBC WITH DIFFERENTIAL/PLATELET - Abnormal; Notable for the following components:   WBC 12.0 (*)    Neutro Abs 9.1 (*)    All other components within normal limits  URINALYSIS, ROUTINE W REFLEX MICROSCOPIC - Abnormal; Notable for the following components:   Hgb urine dipstick MODERATE (*)    Ketones, ur 20 (*)    Bacteria,  UA RARE (*)    All other components within normal limits    EKG None  Radiology DG Ribs Unilateral W/Chest Left  Result Date: 09/18/2021 CLINICAL DATA:  Left chest wall pain. EXAM: LEFT RIBS AND CHEST - 3+ VIEW COMPARISON:  Chest radiograph dated 08/02/2021. FINDINGS: No focal consolidation, pleural effusion or pneumothorax. The cardiac silhouette is within limits. Atherosclerotic calcification of the aortic arch. Fracture of the distal left clavicle, new since the prior radiograph. No displaced rib fractures. IMPRESSION: 1. No acute cardiopulmonary process. 2. Distal left clavicular fracture. Electronically Signed   By: Anner Crete M.D.   On: 09/18/2021 00:54   CT Head Wo Contrast  Result Date: 09/18/2021 CLINICAL DATA:  Fall.  Head trauma, minor (Age >= 65y) EXAM: CT HEAD WITHOUT CONTRAST TECHNIQUE: Contiguous axial images were obtained from the base of the skull through the vertex without intravenous contrast. RADIATION DOSE REDUCTION: This exam was performed according to the departmental dose-optimization program which includes automated exposure control, adjustment of the mA and/or kV according to patient size and/or use of iterative reconstruction technique. COMPARISON:  08/14/2021 FINDINGS: Brain: There is atrophy and chronic small vessel disease changes. No acute intracranial abnormality. Specifically, no hemorrhage, hydrocephalus,  mass lesion, acute infarction, or significant intracranial injury. Vascular: No hyperdense vessel or unexpected calcification. Skull: No acute calvarial abnormality. Sinuses/Orbits: No acute findings Other: Soft tissue swelling in the midline of the forehead. IMPRESSION: Atrophy, chronic microvascular disease. No acute intracranial abnormality. Electronically Signed   By: Rolm Baptise M.D.   On: 09/18/2021 01:32   CT Cervical Spine Wo Contrast  Result Date: 09/18/2021 CLINICAL DATA:  Fall.  Neck trauma (Age >= 65y) EXAM: CT CERVICAL SPINE WITHOUT CONTRAST TECHNIQUE: Multidetector CT imaging of the cervical spine was performed without intravenous contrast. Multiplanar CT image reconstructions were also generated. RADIATION DOSE REDUCTION: This exam was performed according to the departmental dose-optimization program which includes automated exposure control, adjustment of the mA and/or kV according to patient size and/or use of iterative reconstruction technique. COMPARISON:  08/02/2021 FINDINGS: Alignment: Normal Skull base and vertebrae: No acute fracture. No primary bone lesion or focal pathologic process. Soft tissues and spinal canal: No prevertebral fluid or swelling. No visible canal hematoma. Disc levels: Mild disc space narrowing and early spurring. Mild diffuse degenerative facet disease bilaterally. Upper chest: No acute findings Other: Markedly enlarged thyroid. Multiple nodules within the left thyroid lobe. In the setting of significant comorbidities or limited life expectancy, no follow-up recommended (ref: J Am Coll Radiol. 2015 Feb;12(2): 143-50). IMPRESSION: Degenerative changes.  No acute bony abnormality. Electronically Signed   By: Rolm Baptise M.D.   On: 09/18/2021 01:30   CT Thoracic Spine Wo Contrast  Result Date: 09/18/2021 CLINICAL DATA:  Fall EXAM: CT THORACIC AND LUMBAR SPINE WITHOUT CONTRAST TECHNIQUE: Multidetector CT imaging of the thoracic and lumbar spine was performed without  contrast. Multiplanar CT image reconstructions were also generated. RADIATION DOSE REDUCTION: This exam was performed according to the departmental dose-optimization program which includes automated exposure control, adjustment of the mA and/or kV according to patient size and/or use of iterative reconstruction technique. COMPARISON:  No prior CT of the thoracic or lumbar spine, correlation is made with chest radiograph 08/02/2021 and CT abdomen pelvis 12/01/2009. FINDINGS: CT THORACIC SPINE FINDINGS Evaluation is somewhat limited by degree of osteopenia. Alignment: S shaped curvature of the thoracolumbar spine, with levocurvature of the thoracic spine. Exaggeration of the normal thoracic kyphosis. Vertebrae: Osteopenia. Evaluation is also somewhat limited by  motion. No definite vertebral body fracture. Vertebral body heights appear preserved. Paraspinal and other soft tissues: No focal pulmonary opacity or pleural effusion. Enlargement of the left-greater-than-right thyroid lobe, which was previously evaluated with ultrasound on 04/05/2021, which causes rightward deviation of the trachea. Aortic atherosclerosis. Suspected calcified left splenic artery aneurysm (series 3, image 136), which is incompletely evaluated but appears grossly unchanged compared to 12/01/2009. Disc levels: No high-grade spinal canal stenosis or neural foraminal narrowing. CT LUMBAR SPINE FINDINGS Evaluation is somewhat limited by degree of osteopenia. Segmentation: Partial sacralization of L5 with bilateral L5-S1 pseudoarticulation of right-greater-than-left broadened L5 transverse processes with the sacral ala. Alignment: Dextrocurvature of the lumbar spine. Exaggeration of the normal lumbar lordosis. Grade 1 anterolisthesis L4 on L5, which appears degenerative. Vertebrae: Osteopenia. No acute fracture or suspicious osseous lesion. Vertebral body heights are preserved. Paraspinal and other soft tissues: Ectasia of the infrarenal abdominal  aorta, which measures up to 2.5 cm. Fluid density collection posterior to the rectum, which is incompletely evaluated (series 3, image 127). Disc levels: No high-grade spinal canal stenosis or significant neural foraminal narrowing. IMPRESSION: CT THORACIC SPINE IMPRESSION 1. Evaluation is somewhat limited by osteopenia and motion. Within this limitation, no acute fracture or traumatic listhesis. 2. Multinodular goiter, which was most recently evaluated on ultrasound on 04/05/2021, at which time a 1-2 year follow-up was recommended. 3. Calcifications in the area of the left splenic artery, possibly a calcified left splenic artery aneurysm, which is incompletely evaluated but unchanged compared to 12/01/2009. CT LUMBAR SPINE IMPRESSION 1. Evaluation is somewhat limited by osteopenia. Within this limitation, no acute fracture or traumatic listhesis. 2. Fluid density collection posterior to the rectum. This was not present on the 2011 exam and may represent sequela of prior surgery or be related to a prior infection, but is incompletely evaluated. Recommend CT abdomen pelvis for further evaluation. 3. Mild enlargement of the infrarenal abdominal aorta, measuring up to 2.5 cm. This does not meet criteria for aneurysm, and no follow-up imaging is recommended. [place] [phone]. Lumbar impression #2 was called by telephone at the time of interpretation on 09/18/2021 at 1:55 am to provider DR. Christy Gentles , who verbally acknowledged these results. Electronically Signed   By: Merilyn Baba M.D.   On: 09/18/2021 02:00   CT Lumbar Spine Wo Contrast  Result Date: 09/18/2021 CLINICAL DATA:  Fall EXAM: CT THORACIC AND LUMBAR SPINE WITHOUT CONTRAST TECHNIQUE: Multidetector CT imaging of the thoracic and lumbar spine was performed without contrast. Multiplanar CT image reconstructions were also generated. RADIATION DOSE REDUCTION: This exam was performed according to the departmental dose-optimization program which  includes automated exposure control, adjustment of the mA and/or kV according to patient size and/or use of iterative reconstruction technique. COMPARISON:  No prior CT of the thoracic or lumbar spine, correlation is made with chest radiograph 08/02/2021 and CT abdomen pelvis 12/01/2009. FINDINGS: CT THORACIC SPINE FINDINGS Evaluation is somewhat limited by degree of osteopenia. Alignment: S shaped curvature of the thoracolumbar spine, with levocurvature of the thoracic spine. Exaggeration of the normal thoracic kyphosis. Vertebrae: Osteopenia. Evaluation is also somewhat limited by motion. No definite vertebral body fracture. Vertebral body heights appear preserved. Paraspinal and other soft tissues: No focal pulmonary opacity or pleural effusion. Enlargement of the left-greater-than-right thyroid lobe, which was previously evaluated with ultrasound on 04/05/2021, which causes rightward deviation of the trachea. Aortic atherosclerosis. Suspected calcified left splenic artery aneurysm (series 3, image 136), which is incompletely evaluated but appears grossly unchanged compared  to 12/01/2009. Disc levels: No high-grade spinal canal stenosis or neural foraminal narrowing. CT LUMBAR SPINE FINDINGS Evaluation is somewhat limited by degree of osteopenia. Segmentation: Partial sacralization of L5 with bilateral L5-S1 pseudoarticulation of right-greater-than-left broadened L5 transverse processes with the sacral ala. Alignment: Dextrocurvature of the lumbar spine. Exaggeration of the normal lumbar lordosis. Grade 1 anterolisthesis L4 on L5, which appears degenerative. Vertebrae: Osteopenia. No acute fracture or suspicious osseous lesion. Vertebral body heights are preserved. Paraspinal and other soft tissues: Ectasia of the infrarenal abdominal aorta, which measures up to 2.5 cm. Fluid density collection posterior to the rectum, which is incompletely evaluated (series 3, image 127). Disc levels: No high-grade spinal canal  stenosis or significant neural foraminal narrowing. IMPRESSION: CT THORACIC SPINE IMPRESSION 1. Evaluation is somewhat limited by osteopenia and motion. Within this limitation, no acute fracture or traumatic listhesis. 2. Multinodular goiter, which was most recently evaluated on ultrasound on 04/05/2021, at which time a 1-2 year follow-up was recommended. 3. Calcifications in the area of the left splenic artery, possibly a calcified left splenic artery aneurysm, which is incompletely evaluated but unchanged compared to 12/01/2009. CT LUMBAR SPINE IMPRESSION 1. Evaluation is somewhat limited by osteopenia. Within this limitation, no acute fracture or traumatic listhesis. 2. Fluid density collection posterior to the rectum. This was not present on the 2011 exam and may represent sequela of prior surgery or be related to a prior infection, but is incompletely evaluated. Recommend CT abdomen pelvis for further evaluation. 3. Mild enlargement of the infrarenal abdominal aorta, measuring up to 2.5 cm. This does not meet criteria for aneurysm, and no follow-up imaging is recommended. [place] [phone]. Lumbar impression #2 was called by telephone at the time of interpretation on 09/18/2021 at 1:55 am to provider DR. Christy Gentles , who verbally acknowledged these results. Electronically Signed   By: Merilyn Baba M.D.   On: 09/18/2021 02:00   CT ABDOMEN PELVIS W CONTRAST  Result Date: 09/18/2021 CLINICAL DATA:  Abnormal lumbar spine CT.  Intra-abdominal abscess. EXAM: CT ABDOMEN AND PELVIS WITH CONTRAST TECHNIQUE: Multidetector CT imaging of the abdomen and pelvis was performed using the standard protocol following bolus administration of intravenous contrast. RADIATION DOSE REDUCTION: This exam was performed according to the departmental dose-optimization program which includes automated exposure control, adjustment of the mA and/or kV according to patient size and/or use of iterative reconstruction  technique. CONTRAST:  44mL OMNIPAQUE IOHEXOL 300 MG/ML  SOLN COMPARISON:  Lumbar spine CT from earlier the same day and abdominal CT 12/01/2009 FINDINGS: Lower chest:  No contributory findings. Hepatobiliary: Small low densities in the right lobe liver, too small for densitometry. Collapsed/decompressed large cysts in the lower right lobe liver since prior.No evidence of biliary obstruction or stone. Pancreas: Unremarkable. Spleen: Heavy rounded calcification at the splenic hilum measuring 15 mm. No significant parenchymal finding. Adrenals/Urinary Tract: Negative adrenals. No hydronephrosis or stone. Small bilateral renal cystic densities and numerous low densities which are too small for accurate measurement. Unremarkable bladder. Stomach/Bowel: Rectal stool distention to 8.2 cm diameter. No bowel obstruction or visible inflammation. No visible bowel inflammation. Vascular/Lymphatic: No acute vascular abnormality. Atheromatous calcification. 2.5 cm diameter infrarenal aorta. No mass or adenopathy. Reproductive:The cystic finding in question has an anterior claw sign associated with right adnexa/ovarian parenchyma on coronal reformats, most likely growth of the ovarian cyst seen in 2011. The cyst measures up to 5.7 by 2.7 cm and has a simple CT appearance. The following is the standard recommendation, but needs to  be tailored to comorbidities in this case. Because this lesion is not adequately characterized, prompt [place] is recommended for further evaluation. Note: This recommendation does not apply to premenarchal patients and to those with increased risk (genetic, family history, elevated tumor markers or other high-risk factors) of ovarian cancer. Reference: JACR 2020 Feb; 17(2):248-254 Other: No ascites or pneumoperitoneum. Musculoskeletal: No acute abnormalities. Lumbar spine degeneration with dextroscoliosis. Advanced right hip osteoarthritis. IMPRESSION: 1. 5.7 x 2.7 simple cyst in the right posterior pelvis is  most likely ovarian. For the size, pelvic ultrasound characterization is usually undertaken but need correlation with comorbidities. 2. No acute finding or intra-abdominal abscess. 3. Stool distended rectum. Electronically Signed   By: Jorje Guild M.D.   On: 09/18/2021 04:43   DG Pelvis Portable  Result Date: 09/18/2021 CLINICAL DATA:  Frequent falls. EXAM: PORTABLE PELVIS 1-2 VIEWS COMPARISON:  None. FINDINGS: There is no evidence of pelvic fracture or diastasis. No pelvic bone lesions are seen. There is asymmetrically advanced right hip DJD with bone-on-bone joint space loss and circumferential acetabular and femoral head osteophytes. Osteopenia. IMPRESSION: Osteopenia and degenerative changes without AP evidence of displaced fractures. Electronically Signed   By: Telford Nab M.D.   On: 09/18/2021 00:56   DG Shoulder Left  Result Date: 09/18/2021 CLINICAL DATA:  Right shoulder trauma. EXAM: LEFT SHOULDER - 2+ VIEW COMPARISON:  None. FINDINGS: There is osteopenia with an acute closed transverse oblique fracture of the distal left clavicle shaft, proximally 1.7 cm medial to the [facility] joint. The distal fracture fragment remains with the acromion process and is displaced 1/2 of a shaft width inferiorly with respect to the proximal fragment. No AC joint or glenohumeral dislocation is seen. There is no further evidence of fractures. No displaced fracture of the visualized left ribs. IMPRESSION: Acute distal left clavicle fracture with inferior displacement of the distal fracture fragment by 1/2 of the shaft width. No dislocation. Electronically Signed   By: Telford Nab M.D.   On: 09/18/2021 00:54   DG Hand Complete Left  Result Date: 09/18/2021 CLINICAL DATA:  Frequent falls.  Left hand pain. EXAM: LEFT HAND - COMPLETE 3+ VIEW COMPARISON:  None. FINDINGS: Assessment of the second through fifth digits is somewhat limited due to partial flexion of the fingers. There is osteopenia without evidence of  displaced fractures. There is no visible focal soft tissue swelling. There are early degenerative changes of the interphalangeal joints and moderate joint space loss and spurring of the first [facility] joint, but there is no erosive arthropathy. IMPRESSION: Osteopenia and degenerative change without evidence of displaced fractures. Electronically Signed   By: Telford Nab M.D.   On: 09/18/2021 00:58   CT Maxillofacial WO CM  Result Date: 09/18/2021 CLINICAL DATA:  Facial trauma, blunt.  Fall. EXAM: CT MAXILLOFACIAL WITHOUT CONTRAST TECHNIQUE: Multidetector CT imaging of the maxillofacial structures was performed. Multiplanar CT image reconstructions were also generated. RADIATION DOSE REDUCTION: This exam was performed according to the departmental dose-optimization program which includes automated exposure control, adjustment of the mA and/or kV according to patient size and/or use of iterative reconstruction technique. COMPARISON:  08/14/2021 FINDINGS: Osseous: Fractures seen through the anterior in midportion of the right zygomatic arch, not definitively seen on prior head CTs. No additional facial fracture. Orbits: Negative. No traumatic or inflammatory finding. Sinuses: Clear Soft tissues: Soft tissue swelling over the forehead in the midline. Limited intracranial: See head CT report IMPRESSION: Fracture through the right zygomatic arch, not definitively seen on prior head CTs.  Electronically Signed   By: Rolm Baptise M.D.   On: 09/18/2021 01:25    Procedures Procedures    Medications Ordered in ED Medications  LORazepam (ATIVAN) injection 0.5 mg (0.5 mg Intravenous Given 09/18/21 0330)  sodium chloride (PF) 0.9 % injection (  Given by Other 09/18/21 0427)  iohexol (OMNIPAQUE) 300 MG/ML solution 80 mL (80 mLs Intravenous Contrast Given 09/18/21 0351)    ED Course/ Medical Decision Making/ A&P Clinical Course as of 09/18/21 0550  Tue Sep 18, 2021  0123 DG Shoulder Left [WF]    Clinical Course User  Index [WF] Marcello Fennel, PA-C                           Medical Decision Making Amount and/or Complexity of Data Reviewed Labs: ordered. Radiology: ordered. Decision-making details documented in ED Course.  Risk Prescription drug management.   This patient presents to the ED for concern of fall, this involves an extensive number of treatment options, and is a complaint that carries with it a high risk of complications and morbidity.  The differential diagnosis includes orthopedic injury, metabolic abnormality, abuse    Additional history obtained:  Additional history obtained from daughter please see HPI   Co morbidities that complicate the patient evaluation  Dementia  Social Determinants of Health:  Nursing facility    Lab Tests:  I Ordered, and personally interpreted labs.  The pertinent results include: CBC shows slight leukocytosis of 12, BMP shows glucose 105 BUN 33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

## 2021-09-18 NOTE — ED Notes (Signed)
Pt is fidgeting and restless.  Daughter very attentive at the bedside.  Lights dimmed for pt comfort.  Warm blanket given.

## 2021-09-18 NOTE — ED Notes (Signed)
Patient transported to CT 

## 2021-09-24 ENCOUNTER — Non-Acute Institutional Stay: Payer: Medicare Other

## 2021-09-24 ENCOUNTER — Other Ambulatory Visit: Payer: Self-pay

## 2021-09-24 DIAGNOSIS — Z515 Encounter for palliative care: Secondary | ICD-10-CM

## 2021-09-25 ENCOUNTER — Non-Acute Institutional Stay: Payer: Medicare Other | Admitting: Family Medicine

## 2021-09-25 ENCOUNTER — Other Ambulatory Visit: Payer: Self-pay

## 2021-09-25 VITALS — HR 92 | Temp 97.7°F | Resp 18

## 2021-09-25 DIAGNOSIS — R634 Abnormal weight loss: Secondary | ICD-10-CM

## 2021-09-25 DIAGNOSIS — K5900 Constipation, unspecified: Secondary | ICD-10-CM

## 2021-09-25 DIAGNOSIS — Z515 Encounter for palliative care: Secondary | ICD-10-CM

## 2021-09-25 DIAGNOSIS — R296 Repeated falls: Secondary | ICD-10-CM

## 2021-09-25 DIAGNOSIS — S42032D Displaced fracture of lateral end of left clavicle, subsequent encounter for fracture with routine healing: Secondary | ICD-10-CM

## 2021-09-25 DIAGNOSIS — F0390 Unspecified dementia without behavioral disturbance: Secondary | ICD-10-CM

## 2021-09-25 DIAGNOSIS — S0240ED Zygomatic fracture, right side, subsequent encounter for fracture with routine healing: Secondary | ICD-10-CM

## 2021-09-25 NOTE — Progress Notes (Signed)
[facility] Consult Note Telephone: [phone]  Fax: [phone]    Date of encounter: 09/25/21 10: PATIENT NAME: Sophia Hancock [address] [place] [facility] [address]   [phone] (home)  DOB: 10-Aug-1928 MRN: [id number] PRIMARY CARE PROVIDER:    Curly Rim, MD,  [place] [address] [place] [address] [phone]  REFERRING PROVIDER:   Curly Rim, MD [place] [address],  [place] [address] [phone]  RESPONSIBLE PARTY:    Contact Information     Name Relation Home Work [place] Daughter   [phone]   [name],[name] Relative   [phone]        I met face to face with patient in her facility. As provider was getting ready to leave, daughter showed up and findings were discussed with her as well as recommendations to complete MOST form.  Palliative Care was asked to follow this patient by consultation request of  Corrington, Kip A, MD to address advance care planning and complex medical decision making. This is a follow up visit.                                   ASSESSMENT, SYMPTOM MANAGEMENT AND PLAN / RECOMMENDATIONS:   Palliative Care Encounter-stated allergy to Acetaminophen.  Noted nighttime restlessness, using sitters at present for reassurance.  Agree with HS Ativan 0.5 mg, Trazodone 50 mg prn and Depakote sprinkles 125 mg daily with dinner to maintain as normal as possible sleep/wake cycle/avoid oversedation. Attempting to slide out of wheelchair, recommend pressure reduction cushion for wheelchair to improve comfort. Major neurocognitive disorder with behavioral disturbance- Agree with minimizing use of Ativan to nighttime only and may need to halve current dose to 0.25 mg.  Agree with Depakote sprinkles 125 mg once a day and would recommend around dinnertime to avoid daytime oversedation and provide mood stabilization.  Recommend follow up CBC after 4-6 weeks of  use. Falls frequently-question if pt would be able to retain instruction from PT.  Agree with increased supervision and use of sitter at night.  Avoid oversedation with too many CNS depressants at one time (stagger use of benzodiazepine and depakote).  WC for mobility.  Attempt PT eval. Closed fractures of right zygomatic arch and left distal clavicle, mildly displaced-given advanced dementia pt is poorly tolerant of splinting of arm for healing of clavicle.  Schedule Advil 200 mg TID at 8 am, 2 pm and 8 pm x 2 weeks.  Follows up with Orthopedist today. Recent constipation resolving with Senna BID-has had 6-8 soft, formed stools in 24 hours so Senna decreased to 1 tab daily with prn Miralax. Weight loss-126 lbs 8.7 ounces  (57.4 kg) as of 08/02/21.  Continue to encourage intake with protein supplements for medication, between meals as intake dwindling.  Last Albumin 08/03/21 was low at 3.3.   Advance Care Planning/Goals of Care: Goals include to maximize quality of life and symptom management. Health care surrogate gave her permission to discuss. Our advance care planning conversation included a discussion about:    The value and importance of advance care planning-has living will.  Recommended MOST completion to address goals of care Experiences with loved ones who have been seriously ill or have died  Exploration of personal, cultural or spiritual beliefs that might influence medical decisions-daughter states that when pt was recently in rehab that she was able  to begin walking with walker and supervision and was feeding herself. Exploration of goals of care in the event of a sudden injury or illness  Identification of a healthcare agent-daughter Gardiner Sleeper Review of an advance directive document   CODE STATUS: Plan to address CODE status and MOST on next visit when daughter and son-in-law can be scheduled to attend visit as daughter pressed for time today to get pt ready for orthopedist  visit.    Follow up Palliative Care Visit: Palliative care will continue to follow for complex medical decision making, advance care planning, and clarification of goals. Return 2 weeks or prn.    This visit was coded based on medical decision making (MDM).  PPS: 40%  HOSPICE ELIGIBILITY/DIAGNOSIS: TBD  Chief Complaint:  [place] is conducting a follow up visit to address symptom management, goals of care and family concerns after recent fall with injury.  HISTORY OF PRESENT ILLNESS:  Sophia Hancock is a 88 y.o. year old female  with dementia, hypertension, hyperglycemia prior to significant weight loss and recent frequent falls with most recent fall resulting in fractures to left distal clavicle and right zygomatic arch.  She also has a known enlarged thyroid causing rightward axis deviation of trachea.  Pt initially was seen with her sitter and a facility caregiver after they had gotten her dressed and were getting her ready to eat lunch.  Facility caregiver indicates pt is more awake and alert today than in recent days when she has kept her eyes closed.  Adjustments per nursing supervisor were made in benzodiazepine to only give at HS, to decrease Depakote sprinkles from BID to daily at HS as pt was sleeping late into the day.  She did have CT scan of maxillofacial and complete spine/head with xrays of ribs, pelvis, elbow which showed fractures of right zygomatic arch and left clavicle.  Daughter indicates pt was receiving PT at prior rehab facility and had begun to walk a little with supervision, was feeding herself and now is keeping her eyes closed, refusing food.  Daughter questioning possibly taking her home and advised that she will likely need 24/7 supervision no matter where she is located and that they may have to contend with her trying to wander.  History obtained from review of EMR, discussion with primary team, and interview with family, facility  staff/caregiver and/or Ms. Kaspar.  I reviewed available labs, medications, imaging, studies and related documents from the EMR.  Records reviewed and summarized above.   ROS Unable to obtain from pt due to advanced dementia Staff indicate pt may be having more frequent soft, formed stools, that she has been keeping her eyes/mouth closed,   Physical Exam: Constitutional: NAD General: frail appearing, thin (weight appears closer to 108-110) EYES: anicteric sclera, lids intact, no discharge  ENMT: intact hearing, oral mucous membranes tacky, dentition intact CV: S1S2, RRR, no LE edema Pulmonary: CTAB with decreased breath sounds in bilateral bases, no increased work of breathing, no cough, room air Abdomen: normo-active BS + 4 quadrants, soft and non tender, no ascites GU: deferred  MSK: noted sarcopenia of all 4 limbs, moves all extremities,  Skin: warm and dry, no rashes, multiple skin tears on bilateral arms/forearms with dressings intact Neuro:  noted generalized weakness,  noted expressive and receptive aphasia Psych: non-anxious affect, A and O to self Hem/lymph/immuno: no widespread bruising   Thank you for the opportunity to participate in the care of Ms. Wildey.  The palliative care  team will continue to follow. Please call our office at [phone] if we can be of additional assistance.   Marijo Conception, FNP   COVID-19 PATIENT SCREENING TOOL Asked and negative response unless otherwise noted:   Have you had symptoms of covid, tested positive or been in contact with someone with symptoms/positive test in the past 5-10 days?  No

## 2021-09-26 ENCOUNTER — Other Ambulatory Visit: Payer: Self-pay

## 2021-09-26 ENCOUNTER — Encounter: Payer: Self-pay | Admitting: Family Medicine

## 2021-09-26 ENCOUNTER — Non-Acute Institutional Stay: Payer: Medicare Other

## 2021-09-26 DIAGNOSIS — Z515 Encounter for palliative care: Secondary | ICD-10-CM

## 2021-09-26 DIAGNOSIS — R296 Repeated falls: Secondary | ICD-10-CM | POA: Insufficient documentation

## 2021-09-26 DIAGNOSIS — S02402D Zygomatic fracture, unspecified, subsequent encounter for fracture with routine healing: Secondary | ICD-10-CM | POA: Insufficient documentation

## 2021-09-26 DIAGNOSIS — R634 Abnormal weight loss: Secondary | ICD-10-CM | POA: Insufficient documentation

## 2021-09-26 DIAGNOSIS — S42032D Displaced fracture of lateral end of left clavicle, subsequent encounter for fracture with routine healing: Secondary | ICD-10-CM | POA: Insufficient documentation

## 2021-09-26 DIAGNOSIS — K5900 Constipation, unspecified: Secondary | ICD-10-CM | POA: Insufficient documentation

## 2021-09-27 NOTE — Progress Notes (Signed)
PATIENT NAME: Sophia Hancock DOB: 1928-07-05 MRN: [id number]  PRIMARY CARE PROVIDER: Curly Rim, MD  RESPONSIBLE PARTY:  Acct ID - Guarantor Home Phone Work Phone Relationship Acct Type  [id number] Standley [phone]  Self P/F     [place], [place], [place] [address]    Completed Palliative care telephone encounter with patient's daughter,Rachel Apolonio Schneiders expressed concerns about observed patient's decline. Apolonio Schneiders shared patient was recently admitted to a Memory care from SNF. Patient was ambulatory and could verbalize more about 1 month ago. Patient is now sleeping more throughout the day. She has poor safety awareness and has had multiple falls with injury of bruising and skin tears. Daughter is unsure etiology of sleeping- if it is disease progression or medications. Apolonio Schneiders questions if patient is in the appropriate setting and requests to have NP evaluate patient and have in put to patient status/medication review.Palliative care NP updated. Cornelius Moras, RN

## 2021-09-28 ENCOUNTER — Other Ambulatory Visit: Payer: Medicare Other

## 2021-09-28 ENCOUNTER — Other Ambulatory Visit: Payer: Self-pay

## 2021-09-28 ENCOUNTER — Telehealth: Payer: Self-pay | Admitting: *Deleted

## 2021-09-28 DIAGNOSIS — Z515 Encounter for palliative care: Secondary | ICD-10-CM

## 2021-09-28 NOTE — Telephone Encounter (Signed)
Daughter of patient is calling because she has been hospitalized and now is at [facility] rehab/ memory care, is requesting recommendations /information on house call visits since she probably will not be able to bring her to upcoming appointments.Please advise.

## 2021-09-28 NOTE — Progress Notes (Signed)
COMMUNITY PALLIATIVE CARE SW NOTE  PATIENT NAME: [name] DOB: Jul 11, 1928 MRN: [id number]  PRIMARY CARE PROVIDER: Curly Rim, MD  RESPONSIBLE PARTY:  Acct ID - Guarantor Home Phone Work Phone Relationship Acct Type  [id number] Standley [phone]  Self P/F     [place], [place], [place] [address]   Due to the COVID-19 crisis, this virtual check-in visit was done via telephone from my office and it was initiated and consent by this patient and or family   SOCIAL WORK TELEPHONIC ENCOUNTER ( 9:15-9:30 am)  PC SW completed a follow-up call to patient's daughter-[name]. SW provided active listening and resources as she continues to sort out goals for patient and if she will bring patient home or keep her in the facility. SW provided active listening, pros and cons of each, while providing support to her. SW also reinforced education regarding hospice services, benefits and how it is paid for. SW also scheduled a follow-up visit with the palliative care NP, who will assess patient again for additional recommendations and interventions.  No other concerns noted.     [address] [place], 

## 2021-10-02 ENCOUNTER — Non-Acute Institutional Stay: Payer: Medicare Other | Admitting: Family Medicine

## 2021-10-02 ENCOUNTER — Other Ambulatory Visit: Payer: Self-pay

## 2021-10-02 VITALS — HR 76 | Temp 97.4°F | Resp 18

## 2021-10-02 DIAGNOSIS — R410 Disorientation, unspecified: Secondary | ICD-10-CM

## 2021-10-02 DIAGNOSIS — S42032D Displaced fracture of lateral end of left clavicle, subsequent encounter for fracture with routine healing: Secondary | ICD-10-CM

## 2021-10-02 DIAGNOSIS — Z515 Encounter for palliative care: Secondary | ICD-10-CM

## 2021-10-02 DIAGNOSIS — R296 Repeated falls: Secondary | ICD-10-CM

## 2021-10-02 DIAGNOSIS — S0240ED Zygomatic fracture, right side, subsequent encounter for fracture with routine healing: Secondary | ICD-10-CM

## 2021-10-03 ENCOUNTER — Encounter: Payer: Self-pay | Admitting: Family Medicine
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
## 2021-10-14 NOTE — Progress Notes (Signed)
COMMUNITY PALLIATIVE CARE SW NOTE  PATIENT NAME: Sophia Hancock DOB: 1928/08/08 MRN: [id number]  PRIMARY CARE PROVIDER: Curly Rim, MD  RESPONSIBLE PARTY:  Acct ID - Guarantor Home Phone Work Phone Relationship Acct Type  [id number] Standley [phone]  Self P/F     [place], [place], [place] [address]   Due to the COVID-19 crisis, this virtual check-in visit was done via telephone from my office and it was initiated and consent by this patient and or family.  SOCIAL WORK TELEPHONIC ENCOUNTER (1:30-1:45 pm)  PC SW completed a telephonic encounter with patient's daughter-[name], who provided a status update on patient. Her daughter stated that patient has had a bout with COVID, UTI, spent two weeks in the hospital before going to [facility]. Her dementia has increased . She has had two fall on 12/8 and 1/23 that required emergency room visits. Patient has been in a memory care for a week. Patient is currently total care, she is sleeping most of the day, having increased weakness and fatigue. Due to increased weakness, patient is unable to sit up in a wheelchair or in bed. Wells Guiles is going back and forth regarding if she should take patient home to care for her. She was tearful, as she discussed what she has been through with her mother over the past years. She is an only child and has caregiver fatigue and guilt, but is overall emotional. SW provided active listening and support to her, while normalizing and validating her feelings. SW assisted Wells Guiles in process her feelings, weighing the costs and benefits of facility care and taking patient home. SW also provided education to her regarding hospice care and the benefits. Wells Guiles advised that she is open to hospice care as her goal is for patient to have comfort through the end of life. Wells Guiles stated that she wanted to explore the more. SW schedule a follow-up visit with the NP for 10/02/21. Wells Guiles verbalized appreciation for  SW call. No other concerns noted.    [address] [place], [place]

## 2021-10-17 ENCOUNTER — Telehealth: Payer: Self-pay | Admitting: Podiatry

## 2021-10-17 NOTE — Telephone Encounter (Signed)
Pts daughter called and canceled pts upcoming appt in march.  Pts daughter asked me to let you know the pt  is now in hospice care.

## 2021-10-24 ENCOUNTER — Ambulatory Visit: Payer: Medicare Other | Admitting: Podiatry

## 2022-08-27 ENCOUNTER — Other Ambulatory Visit (HOSPITAL_COMMUNITY): Payer: Self-pay

## 2022-08-27 MED ORDER — PANCRELIPASE (LIP-PROT-AMYL) 24000-76000 UNITS PO CPEP
1.0000 | ORAL_CAPSULE | Freq: Three times a day (TID) | ORAL | 0 refills | Status: DC
  Filled 2022-08-27: qty 90, 30d supply, fill #0

## 2022-08-30 ENCOUNTER — Other Ambulatory Visit (HOSPITAL_COMMUNITY): Payer: Self-pay

## 2022-10-07 ENCOUNTER — Other Ambulatory Visit (HOSPITAL_COMMUNITY): Payer: Self-pay

## 2022-10-07 MED ORDER — CREON 24000-76000 UNITS PO CPEP
1.0000 | ORAL_CAPSULE | Freq: Three times a day (TID) | ORAL | 5 refills | Status: AC
  Filled 2022-10-07: qty 30, 10d supply, fill #0

## 2022-10-07 MED ORDER — TRAZODONE HCL 50 MG PO TABS
50.0000 mg | ORAL_TABLET | Freq: Every day | ORAL | 3 refills | Status: DC
  Filled 2022-10-07: qty 30, 30d supply, fill #0

## 2022-10-08 ENCOUNTER — Other Ambulatory Visit (HOSPITAL_COMMUNITY): Payer: Self-pay

## 2022-11-04 ENCOUNTER — Other Ambulatory Visit (HOSPITAL_COMMUNITY): Payer: Self-pay

## 2022-11-25 DEATH — deceased

## 2022-12-23 IMAGING — CT CT ABD-PELV W/ CM
3 of 10 series · 12 of 46 positions shown, 18 images · IV contrast (OMNIPAQUE)
Comparison: Lumbar spine CT from earlier the same day and abdominal
CT 12/01/2009

CLINICAL DATA: Abnormal lumbar spine CT.  Intra-abdominal abscess.

EXAM:
CT ABDOMEN AND PELVIS WITH CONTRAST
TECHNIQUE: Multidetector CT imaging of the abdomen and pelvis was performed
using the standard protocol following bolus administration of
intravenous contrast.

[Series 2: axial st · axial · 0.80mm/px · z∈[-623,-273]mm · 6 of 99 slices shown, 11 images (1 of 2)]
[im 15/99  soft-tissue]
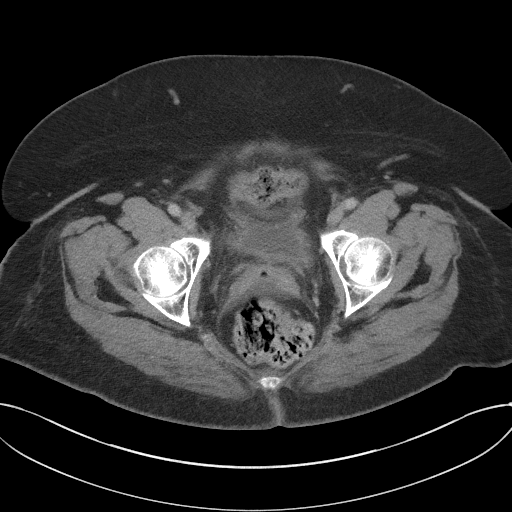
[im 15/99  bone]
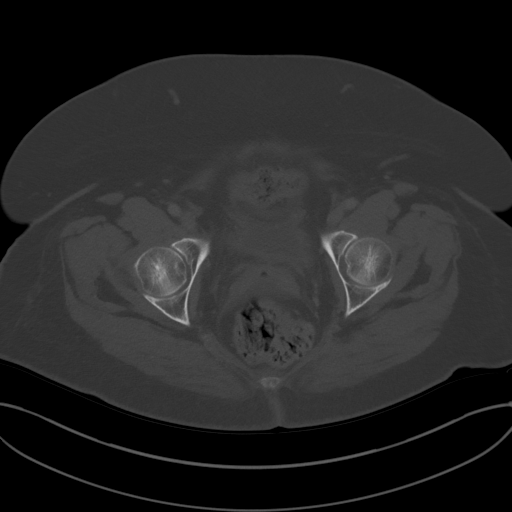
[im 29/99  soft-tissue]
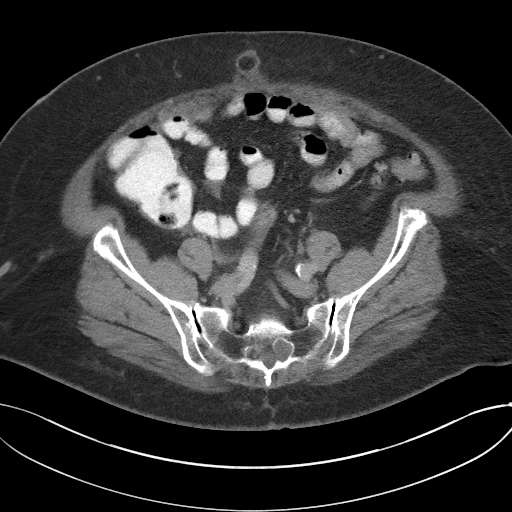
[im 43/99  soft-tissue]
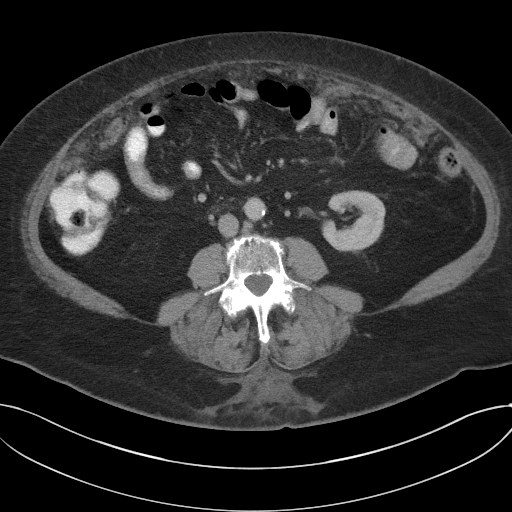
[im 43/99  lung]
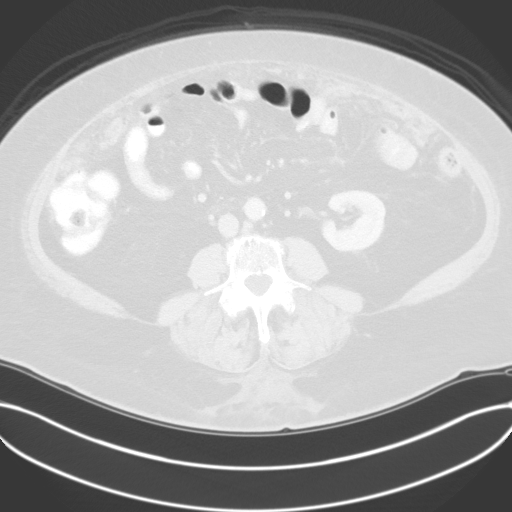
[im 57/99  soft-tissue]
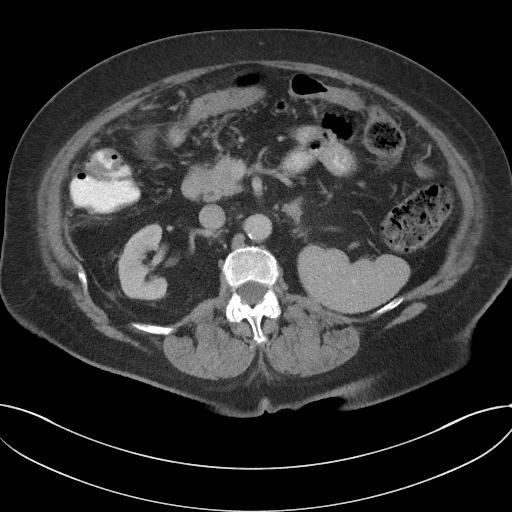
[im 57/99  lung]
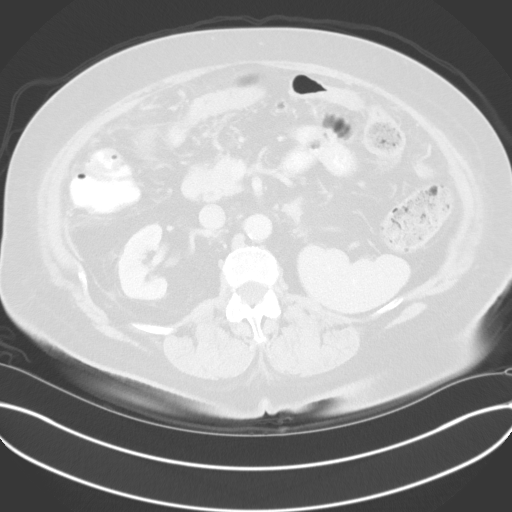
[im 71/99  soft-tissue]
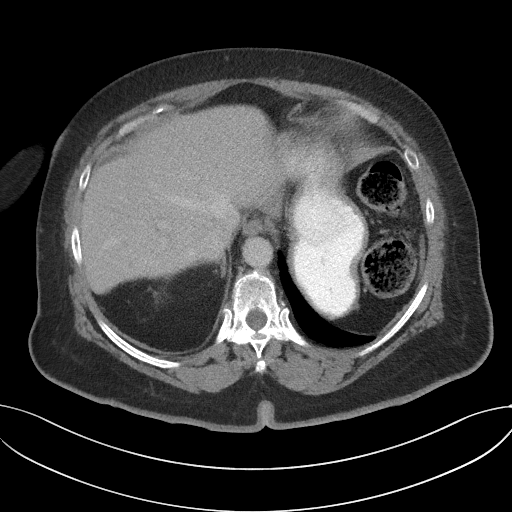
[im 71/99  lung]
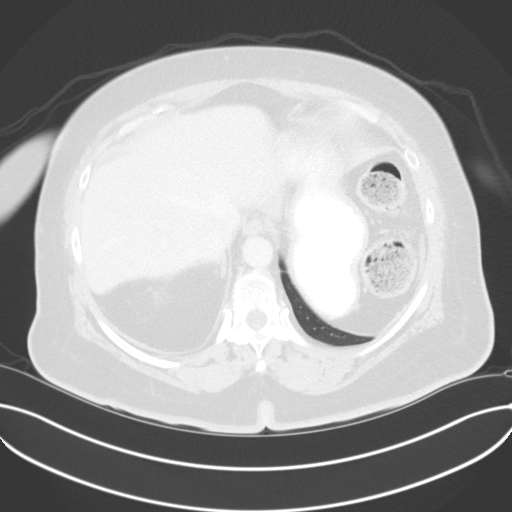
[im 85/99  soft-tissue]
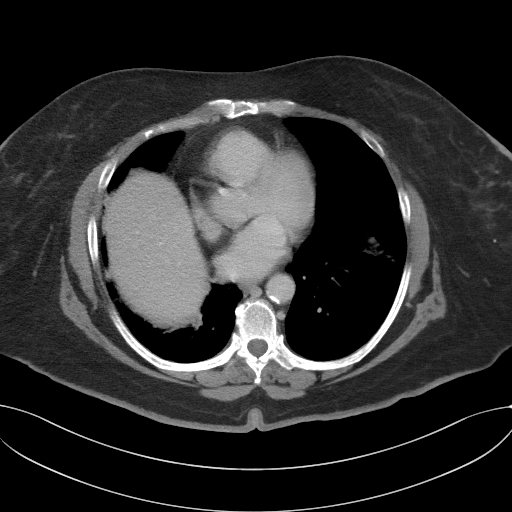
[im 85/99  lung]
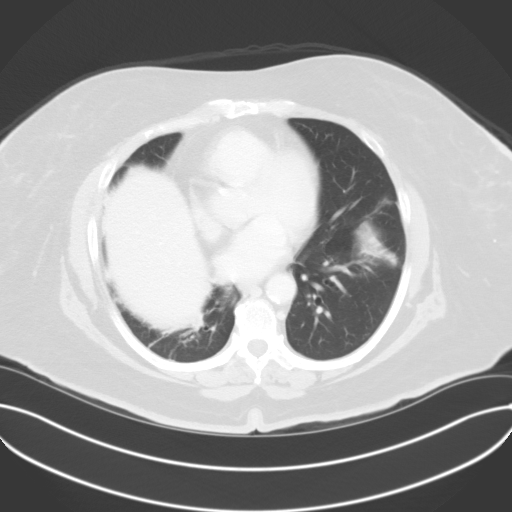

[Series 2: axial st · axial · 0.68mm/px · z∈[-408,-268]mm · 3 of 83 slices shown (2 of 2)]
[im 14/83  soft-tissue]
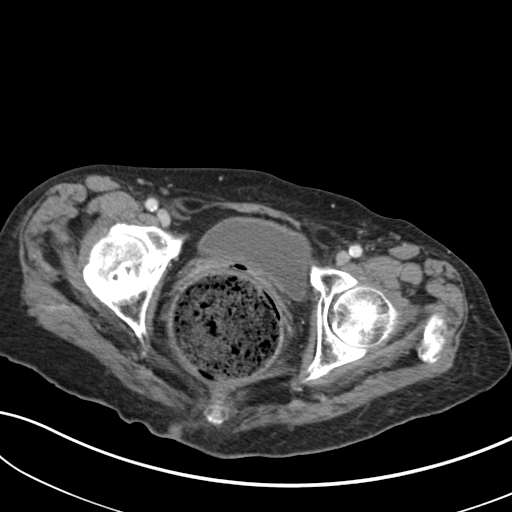
[im 28/83  soft-tissue]
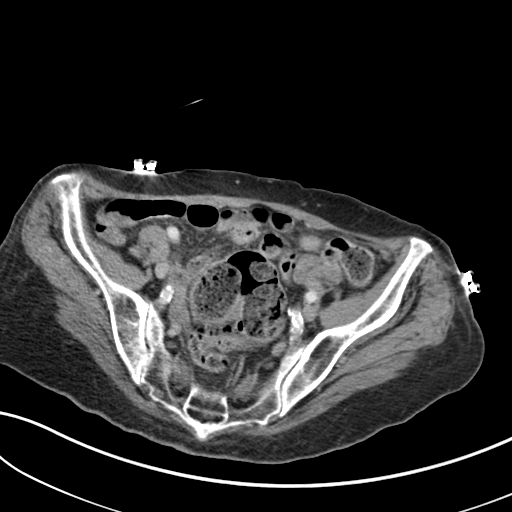
[im 42/83  soft-tissue]
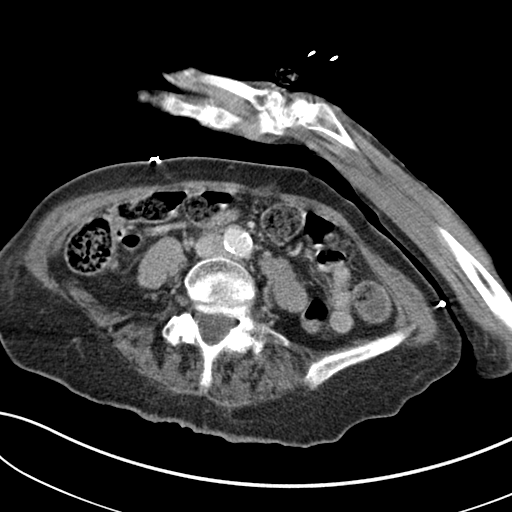

[Series 4: coronal st · coronal · 0.97mm/px · 3 of 109 slices shown, 4 images]
[im 28/109  soft-tissue]
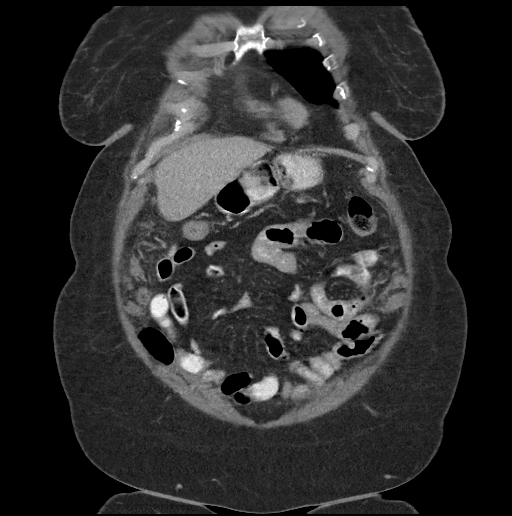
[im 55/109  soft-tissue]
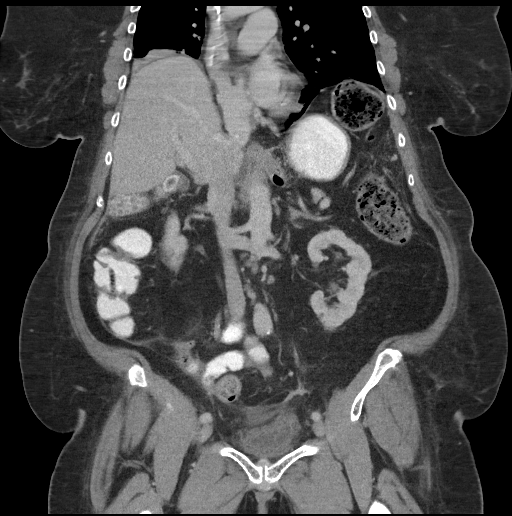
[im 55/109  bone]
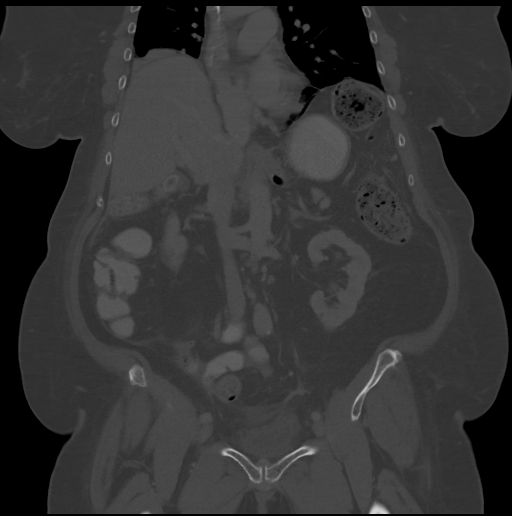
[im 82/109  soft-tissue]
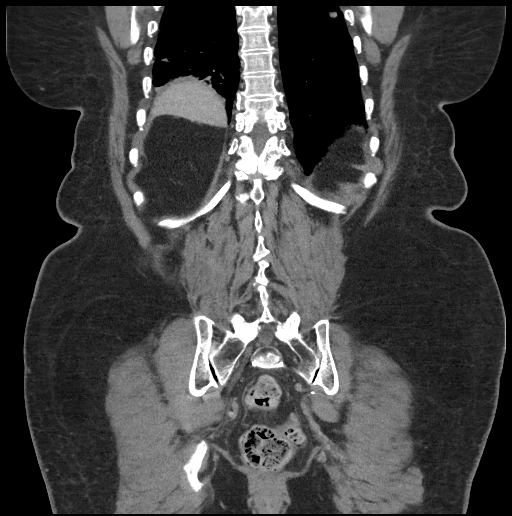

[12 of 46 positions shown; findings below may reference images not displayed]

RADIATION DOSE REDUCTION: This exam was performed according to the
departmental dose-optimization program which includes automated
exposure control, adjustment of the mA and/or kV according to
patient size and/or use of iterative reconstruction technique.

CONTRAST:  80mL OMNIPAQUE IOHEXOL 300 MG/ML  SOLN
FINDINGS: Lower chest:  No contributory findings.

Hepatobiliary: Small low densities in the right lobe liver, too
small for densitometry. Collapsed/decompressed large cysts in the
lower right lobe liver since prior.No evidence of biliary
obstruction or stone.

Pancreas: Unremarkable.

Spleen: Heavy rounded calcification at the splenic hilum measuring
15 mm. No significant parenchymal finding.

Adrenals/Urinary Tract: Negative adrenals. No hydronephrosis or
stone. Small bilateral renal cystic densities and numerous low
densities which are too small for accurate measurement. Unremarkable
bladder.

Stomach/Bowel: Rectal stool distention to 8.2 cm diameter. No bowel
obstruction or visible inflammation. No visible bowel inflammation.

Vascular/Lymphatic: No acute vascular abnormality. Atheromatous
calcification. 2.5 cm diameter infrarenal aorta. No mass or
adenopathy.

Reproductive:The cystic finding in question has an anterior claw
sign associated with right adnexa/ovarian parenchyma on coronal
reformats, most likely growth of the ovarian cyst seen in 4877. The
cyst measures up to 5.7 by 2.7 cm and has a simple CT appearance.
The following is the standard recommendation, but needs to be
tailored to comorbidities in this case. Because this lesion is not
adequately characterized, prompt US is recommended for further
evaluation. Note: This recommendation does not apply to premenarchal
patients and to those with increased risk (genetic, family history,
elevated tumor markers or other high-risk factors) of ovarian
cancer. Reference: JACR [DATE]):[id number]

Other: No ascites or pneumoperitoneum.

Musculoskeletal: No acute abnormalities. Lumbar spine degeneration
with dextroscoliosis. Advanced right hip osteoarthritis.
IMPRESSION: 1. 5.7 x 2.7 simple cyst in the right posterior pelvis is most
likely ovarian. For the size, pelvic ultrasound characterization is
usually undertaken but need correlation with comorbidities.
2. No acute finding or intra-abdominal abscess.
3. Stool distended rectum.
